# Patient Record
Sex: Male | Born: 1960 | Race: White | Hispanic: No | Marital: Single | Smoking: Former smoker
Health system: Southern US, Community
[De-identification: ages and names within clinical notes are randomized; demographics above are authoritative.]

## PROBLEM LIST (undated history)

## (undated) DIAGNOSIS — Z8679 Personal history of other diseases of the circulatory system: Secondary | ICD-10-CM

## (undated) DIAGNOSIS — I10 Essential (primary) hypertension: Secondary | ICD-10-CM

## (undated) DIAGNOSIS — I429 Cardiomyopathy, unspecified: Secondary | ICD-10-CM

## (undated) DIAGNOSIS — K219 Gastro-esophageal reflux disease without esophagitis: Secondary | ICD-10-CM

## (undated) DIAGNOSIS — I517 Cardiomegaly: Secondary | ICD-10-CM

## (undated) DIAGNOSIS — I351 Nonrheumatic aortic (valve) insufficiency: Secondary | ICD-10-CM

## (undated) DIAGNOSIS — Z87442 Personal history of urinary calculi: Secondary | ICD-10-CM

## (undated) HISTORY — PX: HERNIA REPAIR: SHX51

## (undated) HISTORY — PX: HEMORROIDECTOMY: SUR656

---

## 2007-08-27 ENCOUNTER — Encounter: Admission: RE | Admit: 2007-08-27 | Discharge: 2007-08-27 | Payer: Self-pay | Admitting: Family Medicine

## 2014-12-07 ENCOUNTER — Emergency Department
Admission: EM | Admit: 2014-12-07 | Discharge: 2014-12-07 | Disposition: A | Discharge status: Home / Self Care | Payer: Self-pay | Source: Home / Self Care | Attending: Family Medicine | Admitting: Family Medicine

## 2014-12-07 ENCOUNTER — Telehealth: Payer: Self-pay | Admitting: *Deleted

## 2014-12-07 ENCOUNTER — Encounter: Payer: Self-pay | Admitting: Emergency Medicine

## 2014-12-07 ENCOUNTER — Emergency Department (INDEPENDENT_AMBULATORY_CARE_PROVIDER_SITE_OTHER): Payer: Self-pay

## 2014-12-07 DIAGNOSIS — N200 Calculus of kidney: Secondary | ICD-10-CM

## 2014-12-07 DIAGNOSIS — R109 Unspecified abdominal pain: Secondary | ICD-10-CM

## 2014-12-07 DIAGNOSIS — R1010 Upper abdominal pain, unspecified: Secondary | ICD-10-CM

## 2014-12-07 DIAGNOSIS — R1032 Left lower quadrant pain: Secondary | ICD-10-CM

## 2014-12-07 DIAGNOSIS — K5730 Diverticulosis of large intestine without perforation or abscess without bleeding: Secondary | ICD-10-CM

## 2014-12-07 LAB — COMPLETE METABOLIC PANEL WITH GFR
ALT: 20 U/L (ref 0–53)
AST: 18 U/L (ref 0–37)
Albumin: 4.1 g/dL (ref 3.5–5.2)
Alkaline Phosphatase: 47 U/L (ref 39–117)
BILIRUBIN TOTAL: 0.6 mg/dL (ref 0.2–1.2)
BUN: 13 mg/dL (ref 6–23)
CALCIUM: 9 mg/dL (ref 8.4–10.5)
CHLORIDE: 100 meq/L (ref 96–112)
CO2: 26 meq/L (ref 19–32)
CREATININE: 1.07 mg/dL (ref 0.50–1.35)
GFR, EST NON AFRICAN AMERICAN: 79 mL/min
GLUCOSE: 81 mg/dL (ref 70–99)
Potassium: 4.3 mEq/L (ref 3.5–5.3)
Sodium: 137 mEq/L (ref 135–145)
Total Protein: 6.4 g/dL (ref 6.0–8.3)

## 2014-12-07 LAB — POCT URINALYSIS DIP (MANUAL ENTRY)
BILIRUBIN UA: NEGATIVE
GLUCOSE UA: NEGATIVE
Ketones, POC UA: NEGATIVE
Leukocytes, UA: NEGATIVE
Nitrite, UA: NEGATIVE
Protein Ur, POC: NEGATIVE
SPEC GRAV UA: 1.015 (ref 1.005–1.03)
UROBILINOGEN UA: 0.2 (ref 0–1)
pH, UA: 6 (ref 5–8)

## 2014-12-07 MED ORDER — HYDROCODONE-ACETAMINOPHEN 5-325 MG PO TABS: 1.0000 | ORAL_TABLET | ORAL | Status: DC | PRN

## 2014-12-07 MED ORDER — TAMSULOSIN HCL 0.4 MG PO CAPS: 0.4000 mg | ORAL_CAPSULE | Freq: Every day | ORAL | Status: DC

## 2014-12-07 NOTE — ED Notes (Signed)
Lower left Quadrant pain started yesterday, no pain presently

## 2014-12-07 NOTE — ED Provider Notes (Signed)
CSN: [id number]     Arrival date & time 12/07/14  4098 History   First MD Initiated Contact with Patient 12/07/14 [phone]     Chief Complaint  Patient presents with  . Abdominal Pain     HPI Comments: Four days ago while sitting at a table eating, patient suddenly developed sharp stabbing left abdominal pain.that lasted about an hour.  He noticed that his urine was dark.  The pain resolved spontaneously.  Yesterday, again while sitting, patient developed similar but more severe left abdominal pain radiating to the left flank.  The pain lasted about four hours, and he had an episode of nausea/vomiting.  His pain occurred again this morning at 12:30am.  No fevers, chills, and sweats.  He is assymptomatic at present.  No past history of kidney stones.  Patient is a 54 y.o. male presenting with flank pain. The history is provided by the patient.  Flank Pain This is a new problem. Episode onset: 4 days ago. The problem occurs every several days. The problem has been gradually worsening. Associated symptoms include abdominal pain. Pertinent negatives include no chest pain, no headaches and no shortness of breath. Nothing aggravates the symptoms. Nothing relieves the symptoms. Treatments tried: Aleve. The treatment provided mild relief.    History reviewed. No pertinent past medical history. Past Surgical History  Procedure Laterality Date  . Hernia repair    . Hemorroidectomy     No family history on file. History  Substance Use Topics  . Smoking status: Never Smoker   . Smokeless tobacco: Not on file  . Alcohol Use: No    Review of Systems  Constitutional: Positive for diaphoresis. Negative for fever, chills and fatigue.  HENT: Negative.   Eyes: Negative.   Respiratory: Negative.  Negative for shortness of breath.   Cardiovascular: Negative.  Negative for chest pain.  Gastrointestinal: Positive for nausea, vomiting and abdominal pain. Negative for diarrhea.  Genitourinary: Positive for  flank pain. Negative for dysuria, urgency, frequency, hematuria, decreased urine volume, difficulty urinating and testicular pain.  Musculoskeletal: Negative.   Skin: Negative.   Neurological: Negative for headaches.  All other systems reviewed and are negative.   Allergies  Review of patient's allergies indicates no known allergies.  Home Medications   Prior to Admission medications   Medication Sig Start Date End Date Taking? Authorizing Provider  naproxen sodium (ANAPROX) 220 MG tablet Take 220 mg by mouth 2 (two) times daily with a meal.   Yes Historical Provider, MD  HYDROcodone-acetaminophen (NORCO/VICODIN) 5-325 MG per tablet Take 1 tablet by mouth every 4 (four) hours as needed for moderate pain. 12/07/14   Lattie Haw, MD  tamsulosin (FLOMAX) 0.4 MG CAPS capsule Take 1 capsule (0.4 mg total) by mouth daily after supper. 12/07/14   Lattie Haw, MD   BP 149/91 mmHg  Pulse 105  Temp(Src) 98.2 F (36.8 C) (Oral)  Ht 6' (1.829 m)  Wt 202 lb (91.627 kg)  BMI 27.39 kg/m2  SpO2 97% Physical Exam Nursing notes and Vital Signs reviewed. Appearance:  Patient appears stated age, and in no acute distress Eyes:  Pupils are equal, round, and reactive to light and accomodation.  Extraocular movement is intact.  Conjunctivae are not inflamed  Pharynx:  Normal; moist mucous membranes  Neck:  Supple.  No adenopathy  Lungs:  Clear to auscultation.  Breath sounds are equal.  Heart:  Regular rate and rhythm without murmurs, rubs, or gallops.  Abdomen:  Nontender without masses or  hepatosplenomegaly.  Bowel sounds are present.  No CVA or flank tenderness.  Extremities:  No edema.  No calf tenderness Skin:  No rash present.   ED Course  Procedures  None    Labs Reviewed  COMPLETE METABOLIC PANEL WITH GFR   Narrative:    Performed at:  [facility]                [address], [address]                [place], [place] [address]  POCT URINALYSIS DIP (MANUAL ENTRY):  BLO  moderate; otherwise negative    Imaging Review Ct Renal Stone Study  12/07/2014   CLINICAL DATA:  Left lower quadrant and left flank pain with nausea and vomiting. Microscopic hematuria.  EXAM: CT ABDOMEN AND PELVIS WITHOUT CONTRAST  TECHNIQUE: Multidetector CT imaging of the abdomen and pelvis was performed following the standard protocol without oral or intravenous contrast material administration.  COMPARISON:  None.  FINDINGS: There is patchy bibasilar lung atelectatic change.  Liver is enlarged, measuring 22.4 cm in length. No focal liver lesions are identified on this noncontrast enhanced study. There is no appreciable biliary duct dilatation. Gallbladder wall is not thickened.  Spleen, pancreas, and adrenals appear normal. There is a 4 mm nonobstructing calculus in the upper pole of the right kidney. There is a nearby tiny calculus in the upper pole of the right kidney. There is a 3 mm calculus in the lower pole of the  right kidney, nonobstructing. There is a cyst in the medial mid right kidney measuring 1.8 x 1.5 cm. There is no right renal hydronephrosis. There is no ureteral calculus on the right.  On the left, the kidney is diffusely edematous. There is moderate hydronephrosis on the left. There are two, 1 mm calculi in the mid left kidney. In the lower pole region, there are several 1 mm calculi with a nearby 4 x 2 mm calculus, nonobstructing. There is a focal calculus just beyond the ureteropelvic junction on the left measuring 7 x 6 mm. No other ureteral calculus is appreciable.  In the pelvis, the urinary bladder is midline with normal wall thickness. There are scattered prostatic calculi. There is no pelvic mass or pelvic fluid collection. There are scattered sigmoid diverticula without diverticulitis. Appendix appears within normal limits.  There is no bowel obstruction. No free air or portal venous air. There is no appreciable ascites, adenopathy, or abscess in the abdomen or pelvis. There is  a circumaortic left renal vein, an anatomic variant. There is no demonstrable abdominal aortic aneurysm. There are no blastic or lytic bone lesions. There is degenerative change in the lumbar spine.  IMPRESSION: 7 x 6 mm calculus just beyond the left ureteropelvic junction causing moderate hydronephrosis on the left in left renal edema. There arm multiple small nonobstructing calculi in each kidney.  No bowel obstruction.  No abscess.  Appendix region appears normal.  Hepatomegaly.  No focal liver lesions appreciable.  Scattered sigmoid diverticula without diverticulitis.   Electronically Signed   By: Bretta BangWilliam  Woodruff III M.D.   On: 12/07/2014 09:49     MDM   1. Left lower quadrant pain   2. Flank pain, acute   3. Left nephrolithiasis    Begin Flomax.  Check renal function with CMP  Increase fluid intake.  Strain urine.  May take Ibuprofen 200mg , 4 tabs every 8 hours with food.   If symptoms become significantly worse during the  night or over the weekend, proceed to the local emergency room.  Followup with urologist in 48 hours    Lattie Haw, MD 12/09/14 [phone]

## 2014-12-07 NOTE — Discharge Instructions (Signed)
Increase fluid intake.  Strain urine.  May take Ibuprofen 200mg , 4 tabs every 8 hours with food.   If symptoms become significantly worse during the night or over the weekend, proceed to the local emergency room.    Kidney Stones Kidney stones (urolithiasis) are deposits that form inside your kidneys. The intense pain is caused by the stone moving through the urinary tract. When the stone moves, the ureter goes into spasm around the stone. The stone is usually passed in the urine.  CAUSES   A disorder that makes certain neck glands produce too much parathyroid hormone (primary hyperparathyroidism).  A buildup of uric acid crystals, similar to gout in your joints.  Narrowing (stricture) of the ureter.  A kidney obstruction present at birth (congenital obstruction).  Previous surgery on the kidney or ureters.  Numerous kidney infections. SYMPTOMS   Feeling sick to your stomach (nauseous).  Throwing up (vomiting).  Blood in the urine (hematuria).  Pain that usually spreads (radiates) to the groin.  Frequency or urgency of urination. DIAGNOSIS   Taking a history and physical exam.  Blood or urine tests.  CT scan.  Occasionally, an examination of the inside of the urinary bladder (cystoscopy) is performed. TREATMENT   Observation.  Increasing your fluid intake.  Extracorporeal shock wave lithotripsy--This is a noninvasive procedure that uses shock waves to break up kidney stones.  Surgery may be needed if you have severe pain or persistent obstruction. There are various surgical procedures. Most of the procedures are performed with the use of small instruments. Only small incisions are needed to accommodate these instruments, so recovery time is minimized. The size, location, and chemical composition are all important variables that will determine the proper choice of action for you. Talk to your health care provider to better understand your situation so that you will  minimize the risk of injury to yourself and your kidney.  HOME CARE INSTRUCTIONS   Drink enough water and fluids to keep your urine clear or pale yellow. This will help you to pass the stone or stone fragments.  Strain all urine through the provided strainer. Keep all particulate matter and stones for your health care provider to see. The stone causing the pain may be as small as a grain of salt. It is very important to use the strainer each and every time you pass your urine. The collection of your stone will allow your health care provider to analyze it and verify that a stone has actually passed. The stone analysis will often identify what you can do to reduce the incidence of recurrences.  Only take over-the-counter or prescription medicines for pain, discomfort, or fever as directed by your health care provider.  Make a follow-up appointment with your health care provider as directed.  Get follow-up X-rays if required. The absence of pain does not always mean that the stone has passed. It may have only stopped moving. If the urine remains completely obstructed, it can cause loss of kidney function or even complete destruction of the kidney. It is your responsibility to make sure X-rays and follow-ups are completed. Ultrasounds of the kidney can show blockages and the status of the kidney. Ultrasounds are not associated with any radiation and can be performed easily in a matter of minutes. SEEK MEDICAL CARE IF:  You experience pain that is progressive and unresponsive to any pain medicine you have been prescribed. SEEK IMMEDIATE MEDICAL CARE IF:   Pain cannot be controlled with the prescribed medicine.  You have a fever or shaking chills.  The severity or intensity of pain increases over 18 hours and is not relieved by pain medicine.  You develop a new onset of abdominal pain.  You feel faint or pass out.  You are unable to urinate. MAKE SURE YOU:   Understand these  instructions.  Will watch your condition.  Will get help right away if you are not doing well or get worse. Document Released: 08/14/2005 Document Revised: 04/16/2013 Document Reviewed: 01/15/2013 [facility] Patient Information 2015 [place], [place]. This information is not intended to replace advice given to you by your health care provider. Make sure you discuss any questions you have with your health care provider.

## 2015-02-12 ENCOUNTER — Emergency Department (INDEPENDENT_AMBULATORY_CARE_PROVIDER_SITE_OTHER)
Admission: EM | Admit: 2015-02-12 | Discharge: 2015-02-12 | Disposition: A | Discharge status: Home / Self Care | Payer: Self-pay | Source: Home / Self Care | Attending: Family Medicine | Admitting: Family Medicine

## 2015-02-12 ENCOUNTER — Encounter: Payer: Self-pay | Admitting: Emergency Medicine

## 2015-02-12 DIAGNOSIS — H6123 Impacted cerumen, bilateral: Secondary | ICD-10-CM

## 2015-02-12 MED ORDER — CARBAMIDE PEROXIDE 6.5 % OT SOLN: OTIC | Status: DC

## 2015-02-12 NOTE — Discharge Instructions (Signed)
Cerumen Impaction °A cerumen impaction is when the wax in your ear forms a plug. This plug usually causes reduced hearing. Sometimes it also causes an earache or dizziness. Removing a cerumen impaction can be difficult and painful. The wax sticks to the ear canal. The canal is sensitive and bleeds easily. If you try to remove a heavy wax buildup with a cotton tipped swab, you may push it in further. °Irrigation with water, suction, and small ear curettes may be used to clear out the wax. If the impaction is fixed to the skin in the ear canal, ear drops may be needed for a few days to loosen the wax. People who build up a lot of wax frequently can use ear wax removal products available in your local drugstore. °SEEK MEDICAL CARE IF:  °You develop an earache, increased hearing loss, or marked dizziness. °Document Released: 09/21/2004 Document Revised: 11/06/2011 Document Reviewed: 11/11/2009 °[employer]® Patient Information ©2015 [employer]. This information is not intended to replace advice given to you by your health care provider. Make sure you discuss any questions you have with your health care provider. ° °

## 2015-02-12 NOTE — ED Notes (Signed)
Pt states he has been unable to hear from his left ear x1 month. Denies pain or pressure.

## 2015-02-12 NOTE — ED Provider Notes (Signed)
CSN: [id number]     Arrival date & time 02/12/15  1617 History   First MD Initiated Contact with Patient 02/12/15 1637     Chief Complaint  Patient presents with  . Otalgia      HPI Comments: About one month ago, patient's left ear would feel clogged each morning.  During the past two weeks he has had decreased hearing in his left ear without pain.    Patient is a 54 y.o. male presenting with ear pain. The history is provided by the patient.  Otalgia Location:  Left Behind ear:  No abnormality Quality: decreased hearing. Severity:  No pain Onset quality:  Gradual Duration:  1 month Timing:  Constant Progression:  Worsening Chronicity:  New Relieved by:  Nothing Worsened by:  Nothing tried Ineffective treatments:  OTC medications Associated symptoms: hearing loss   Associated symptoms: no congestion, no cough, no ear discharge, no fever, no headaches, no neck pain, no rash, no rhinorrhea and no sore throat     History reviewed. No pertinent past medical history. Past Surgical History  Procedure Laterality Date  . Hernia repair    . Hemorroidectomy     History reviewed. No pertinent family history. History  Substance Use Topics  . Smoking status: Never Smoker   . Smokeless tobacco: Not on file  . Alcohol Use: No    Review of Systems  Constitutional: Negative for fever.  HENT: Positive for ear pain and hearing loss. Negative for congestion, ear discharge, rhinorrhea and sore throat.   Respiratory: Negative for cough.   Musculoskeletal: Negative for neck pain.  Skin: Negative for rash.  Neurological: Negative for headaches.  All other systems reviewed and are negative.   Allergies  Review of patient's allergies indicates no known allergies.  Home Medications   Prior to Admission medications   Medication Sig Start Date End Date Taking? Authorizing Provider  carbamide peroxide (DEBROX) 6.5 % otic solution Place 5 to 10 drops in affected ear BID for 4 days, then  flush ear with warm water. 02/12/15   Lattie Haw, MD  naproxen sodium (ANAPROX) 220 MG tablet Take 220 mg by mouth 2 (two) times daily with a meal.    Historical Provider, MD   BP 131/79 mmHg  Pulse 88  Temp(Src) 98 F (36.7 C) (Oral)  SpO2 97% Physical Exam Nursing notes and Vital Signs reviewed. Appearance:  Patient appears stated age, and in no acute distress Eyes:  Pupils are equal, round, and reactive to light and accomodation.  Extraocular movement is intact.  Conjunctivae are not inflamed  Ears:   Both canals are completely occluded with cerumen.  Post lavage, canals remain partially occluded Nose:   Normal turbinates.  No sinus tenderness.   Pharynx:  Normal Neck:  Supple.  No adenopathy Skin:  No rash present.   ED Course  Procedures  None     MDM   1. Cerumen impaction, bilateral     Rx for Debrox otic solution.  Use BID for 4 days, then try flushing ears with warm water.  Also may return in four days for repeat lavage, or follow-up with ENT.    Lattie Haw, MD 02/12/15 [phone]

## 2016-08-31 ENCOUNTER — Emergency Department
Admission: EM | Admit: 2016-08-31 | Discharge: 2016-08-31 | Disposition: A | Discharge status: Home / Self Care | Payer: Self-pay | Source: Home / Self Care | Attending: Family Medicine | Admitting: Family Medicine

## 2016-08-31 ENCOUNTER — Encounter: Payer: Self-pay | Admitting: *Deleted

## 2016-08-31 DIAGNOSIS — J069 Acute upper respiratory infection, unspecified: Secondary | ICD-10-CM

## 2016-08-31 HISTORY — DX: Personal history of other diseases of the circulatory system: Z86.79

## 2016-08-31 HISTORY — DX: Cardiomyopathy, unspecified: I42.9

## 2016-08-31 HISTORY — DX: Cardiomegaly: I51.7

## 2016-08-31 MED ORDER — ONDANSETRON 4 MG PO TBDP
4.0000 mg | ORAL_TABLET | Freq: Once | ORAL | Status: AC
  Administered 2016-08-31: 4 mg via ORAL

## 2016-08-31 MED ORDER — AMOXICILLIN-POT CLAVULANATE 875-125 MG PO TABS: 1.0000 | ORAL_TABLET | Freq: Two times a day (BID) | ORAL | 0 refills | Status: DC

## 2016-08-31 MED ORDER — METHYLPREDNISOLONE ACETATE 80 MG/ML IJ SUSP
80.0000 mg | Freq: Once | INTRAMUSCULAR | Status: AC
  Administered 2016-08-31: 80 mg via INTRAMUSCULAR

## 2016-08-31 MED ORDER — BENZONATATE 100 MG PO CAPS: 100.0000 mg | ORAL_CAPSULE | Freq: Three times a day (TID) | ORAL | 0 refills | Status: DC

## 2016-08-31 NOTE — ED Provider Notes (Signed)
CSN: [id number]     Arrival date & time 08/31/16  0810 History   First MD Initiated Contact with Patient 08/31/16 0827     Chief Complaint  Patient presents with  . Cough  . Generalized Body Aches  . Fatigue   (Consider location/radiation/quality/duration/timing/severity/associated sxs/prior Treatment) HPI  Marcelis Wissner is a 56 y.o. male presenting to UC with c/o 2 weeks of mild to moderately productive cough, nasal congestion, fatigue, and possible low-grade fever.  He notes he started to feel better but then felt a lot worse last night.  Cough did keep him up at night.  He has taken OTC cough/cold medication with mild temporary relief. Others around him have also been sick.  Denies hx of asthma but does have hx of CAD and takes several cardiac medications daily.  Denies chest pain or SOB. Denies n/v/d.    Past Medical History:  Diagnosis Date  . Cardiomyopathy (HCC)   . Enlarged heart   . History of myocarditis    Past Surgical History:  Procedure Laterality Date  . HEMORROIDECTOMY    . HERNIA REPAIR     History reviewed. No pertinent family history. Social History  Substance Use Topics  . Smoking status: Never Smoker  . Smokeless tobacco: Never Used  . Alcohol use No    Review of Systems  Constitutional: Positive for fever ( subjective). Negative for chills.  HENT: Positive for congestion, postnasal drip, rhinorrhea and sinus pressure. Negative for ear pain, sinus pain, sore throat, trouble swallowing and voice change.   Respiratory: Positive for cough. Negative for shortness of breath.   Cardiovascular: Negative for chest pain and palpitations.  Gastrointestinal: Negative for abdominal pain, diarrhea, nausea and vomiting.  Musculoskeletal: Positive for arthralgias and myalgias. Negative for back pain.       Body aches  Skin: Negative for rash.  Neurological: Negative for dizziness, light-headedness and headaches.    Allergies  Patient has no known allergies.  Home  Medications   Prior to Admission medications   Medication Sig Start Date End Date Taking? Authorizing Provider  aspirin EC 81 MG tablet Take 81 mg by mouth daily.   Yes Historical Provider, MD  CARVEDILOL PO Take by mouth.   Yes Historical Provider, MD  furosemide (LASIX) 40 MG tablet Take 40 mg by mouth.   Yes Historical Provider, MD  losartan (COZAAR) 50 MG tablet Take 50 mg by mouth daily.   Yes Historical Provider, MD  spironolactone (ALDACTONE) 25 MG tablet Take 25 mg by mouth daily.   Yes Historical Provider, MD  amoxicillin-clavulanate (AUGMENTIN) 875-125 MG tablet Take 1 tablet by mouth 2 (two) times daily. One po bid x 7 days 08/31/16   Junius Finner, PA-C  benzonatate (TESSALON) 100 MG capsule Take 1-2 capsules (100-200 mg total) by mouth every 8 (eight) hours. 08/31/16   Junius Finner, PA-C   Meds Ordered and Administered this Visit   Medications  ondansetron (ZOFRAN-ODT) disintegrating tablet 4 mg (4 mg Oral Given 08/31/16 0843)  methylPREDNISolone acetate (DEPO-MEDROL) injection 80 mg (80 mg Intramuscular Given 08/31/16 0843)    BP 113/71 (BP Location: Left Arm)   Pulse 104   Temp 98.9 F (37.2 C) (Oral)   Resp 18   Wt 187 lb (84.8 kg)   SpO2 96%   BMI 25.36 kg/m  No data found.   Physical Exam  Constitutional: He is oriented to person, place, and time. He appears well-developed and well-nourished. No distress.  HENT:  Head: Normocephalic and atraumatic.  Right Ear: Tympanic membrane normal.  Left Ear: Tympanic membrane normal.  Nose: Rhinorrhea present. Right sinus exhibits maxillary sinus tenderness. Right sinus exhibits no frontal sinus tenderness. Left sinus exhibits no maxillary sinus tenderness and no frontal sinus tenderness.  Mouth/Throat: Uvula is midline and mucous membranes are normal. Posterior oropharyngeal erythema present. No oropharyngeal exudate, posterior oropharyngeal edema or tonsillar abscesses.  Eyes: EOM are normal.  Neck: Normal range of motion.  Neck supple.  Cardiovascular: Normal rate and regular rhythm.   Pulmonary/Chest: Effort normal and breath sounds normal. No stridor. No respiratory distress. He has no wheezes. He has no rales.  Musculoskeletal: Normal range of motion.  Lymphadenopathy:    He has no cervical adenopathy.  Neurological: He is alert and oriented to person, place, and time.  Skin: Skin is warm and dry. He is not diaphoretic.  Psychiatric: He has a normal mood and affect. His behavior is normal.  Nursing note and vitals reviewed.   Urgent Care Course   Clinical Course     Procedures (including critical care time)  Labs Review Labs Reviewed - No data to display  Imaging Review No results found.    MDM   1. Upper respiratory tract infection, unspecified type    Pt c/o 2 weeks of URI symptoms, worse last night. Due to duration, will cover for bacterial cause.    Pt requested a "shot" Offered depo-medrol shot to help with inflammation and soreness.  Pt agreeable.    Rx: Augmentin and tessalon  Encouraged f/u with PCP in 1 week if not improving, sooner if worsening.       Junius FinnerErin O'Malley, PA-C 08/31/16 [phone]

## 2016-08-31 NOTE — ED Triage Notes (Signed)
Patient c/o 2 weeks of cough, congestion , fatigue, possible low grade fever. He improved but became worse last night.

## 2020-02-12 ENCOUNTER — Emergency Department
Admission: EM | Admit: 2020-02-12 | Discharge: 2020-02-12 | Disposition: A | Discharge status: Home / Self Care | Payer: Self-pay | Source: Home / Self Care

## 2020-02-12 ENCOUNTER — Other Ambulatory Visit: Payer: Self-pay

## 2020-02-12 ENCOUNTER — Emergency Department (INDEPENDENT_AMBULATORY_CARE_PROVIDER_SITE_OTHER): Payer: Self-pay

## 2020-02-12 DIAGNOSIS — N3001 Acute cystitis with hematuria: Secondary | ICD-10-CM

## 2020-02-12 DIAGNOSIS — N200 Calculus of kidney: Secondary | ICD-10-CM

## 2020-02-12 DIAGNOSIS — R109 Unspecified abdominal pain: Secondary | ICD-10-CM

## 2020-02-12 LAB — POCT URINALYSIS DIP (MANUAL ENTRY)
Glucose, UA: NEGATIVE mg/dL
Nitrite, UA: POSITIVE — AB
Protein Ur, POC: 30 mg/dL — AB
Spec Grav, UA: 1.025 (ref 1.010–1.025)
Urobilinogen, UA: 1 E.U./dL
pH, UA: 6 (ref 5.0–8.0)

## 2020-02-12 LAB — POCT CBC W AUTO DIFF (K'VILLE URGENT CARE)

## 2020-02-12 LAB — COMPLETE METABOLIC PANEL WITH GFR
AG Ratio: 1.5 (calc) (ref 1.0–2.5)
ALT: 18 U/L (ref 9–46)
AST: 13 U/L (ref 10–35)
Albumin: 4.1 g/dL (ref 3.6–5.1)
Alkaline phosphatase (APISO): 52 U/L (ref 35–144)
BUN: 19 mg/dL (ref 7–25)
CO2: 28 mmol/L (ref 20–32)
Calcium: 8.7 mg/dL (ref 8.6–10.3)
Chloride: 104 mmol/L (ref 98–110)
Creat: 1.17 mg/dL (ref 0.70–1.33)
GFR, Est African American: 80 mL/min/{1.73_m2} (ref >=60)
GFR, Est Non African American: 69 mL/min/{1.73_m2} (ref >=60)
Globulin: 2.7 g/dL (calc) (ref 1.9–3.7)
Glucose, Bld: 88 mg/dL (ref 65–99)
Potassium: 4.2 mmol/L (ref 3.5–5.3)
Sodium: 138 mmol/L (ref 135–146)
Total Bilirubin: 0.9 mg/dL (ref 0.2–1.2)
Total Protein: 6.8 g/dL (ref 6.1–8.1)

## 2020-02-12 MED ORDER — TAMSULOSIN HCL 0.4 MG PO CAPS: 0.4000 mg | ORAL_CAPSULE | Freq: Every day | ORAL | 0 refills | Status: AC

## 2020-02-12 MED ORDER — DOXYCYCLINE HYCLATE 100 MG PO CAPS: 100.0000 mg | ORAL_CAPSULE | Freq: Two times a day (BID) | ORAL | 0 refills | Status: AC

## 2020-02-12 NOTE — ED Triage Notes (Signed)
Patient reports 4-5 days intermittent right sided flank pain. Denies pain yesterday or today. Reports fevers tmax 100.3. Has been taking ibuprofen and tylenol with relief. Denies dysuria, hematuria. Does have a history of kidney stones but states this presentation is different.

## 2020-02-12 NOTE — ED Provider Notes (Signed)
[facility]    CSN: [id number] Arrival date & time: 02/12/20  0827      History   Chief Complaint Chief Complaint  Patient presents with  . Flank Pain    HPI Andre Davis is a 59 y.o. male.   HPI Andre Davis is a 59 y.o. male presenting to UC with c/o intermittent Right flank pain that started about 4-5 days ago. Improves in the morning but moderate to severe later in the day.  Associated temp of 100.3*F.  He has taken ibuprofen and tylenol with temporary relief.  Denies n/v/d. Denies dysuria or hematuria. He does report hx of a kidney stone about 1 year ago, complicated by a kidney infection that required him to be hospitalized for over 1 week prior to having the stone removed.  However, pt states this presentation is different than his last stone.    Past Medical History:  Diagnosis Date  . Cardiomyopathy (HCC)   . Enlarged heart   . History of myocarditis     There are no problems to display for this patient.   Past Surgical History:  Procedure Laterality Date  . HEMORROIDECTOMY    . HERNIA REPAIR         Home Medications    Prior to Admission medications   Medication Sig Start Date End Date Taking? Authorizing Provider  aspirin EC 81 MG tablet Take 81 mg by mouth daily.   Yes [provider]  CARVEDILOL PO Take by mouth.   Yes [provider]  furosemide (LASIX) 40 MG tablet Take 40 mg by mouth.   Yes [provider]  losartan (COZAAR) 50 MG tablet Take 50 mg by mouth daily.   Yes [provider]  benzonatate (TESSALON) 100 MG capsule Take 1-2 capsules (100-200 mg total) by mouth every 8 (eight) hours. 08/31/16   Lurene Shadow, PA-C  doxycycline (VIBRAMYCIN) 100 MG capsule Take 1 capsule (100 mg total) by mouth 2 (two) times daily for 10 days. 02/12/20 02/22/20  Lurene Shadow, PA-C  spironolactone (ALDACTONE) 25 MG tablet Take 25 mg by mouth daily.    [provider]  tamsulosin (FLOMAX) 0.4 MG CAPS capsule  Take 1 capsule (0.4 mg total) by mouth daily. 02/12/20   Lurene Shadow, PA-C    Family History History reviewed. No pertinent family history.  Social History Social History   Tobacco Use  . Smoking status: Never Smoker  . Smokeless tobacco: Never Used  Substance Use Topics  . Alcohol use: No  . Drug use: No     Allergies   Patient has no known allergies.   Review of Systems Review of Systems  Constitutional: Positive for fever. Negative for chills.  HENT: Negative for congestion, ear pain, sore throat, trouble swallowing and voice change.   Respiratory: Negative for cough and shortness of breath.   Cardiovascular: Negative for chest pain and palpitations.  Gastrointestinal: Negative for abdominal pain, diarrhea, nausea and vomiting.  Genitourinary: Positive for flank pain. Negative for dysuria, frequency and hematuria.  Musculoskeletal: Negative for arthralgias, back pain and myalgias.  Skin: Negative for rash.  All other systems reviewed and are negative.    Physical Exam Triage Vital Signs ED Triage Vitals  Enc Vitals Group     BP 02/12/20 0843 (!) 158/81     Pulse Rate 02/12/20 0843 78     Resp 02/12/20 0843 12     Temp 02/12/20 0843 98.2 F (36.8 C)  Temp Source 02/12/20 0843 Oral     SpO2 02/12/20 0843 98 %     Weight --      Height --      Head Circumference --      Peak Flow --      Pain Score 02/12/20 0839 0     Pain Loc --      Pain Edu? --      Excl. in GC? --    No data found.  Updated Vital Signs BP (!) 158/81 (BP Location: Right Arm)   Pulse 78   Temp 98.2 F (36.8 C) (Oral)   Resp 12   SpO2 98%   Visual Acuity Right Eye Distance:   Left Eye Distance:   Bilateral Distance:    Right Eye Near:   Left Eye Near:    Bilateral Near:     Physical Exam Vitals and nursing note reviewed.  Constitutional:      General: He is not in acute distress.    Appearance: Normal appearance. He is well-developed. He is not ill-appearing,  toxic-appearing or diaphoretic.  HENT:     Head: Normocephalic and atraumatic.     Mouth/Throat:     Mouth: Mucous membranes are moist.     Pharynx: Oropharynx is clear.  Cardiovascular:     Rate and Rhythm: Normal rate and regular rhythm.  Pulmonary:     Effort: Pulmonary effort is normal. No respiratory distress.     Breath sounds: Normal breath sounds. No stridor. No wheezing, rhonchi or rales.  Abdominal:     General: There is no distension.     Palpations: Abdomen is soft. There is no mass.     Tenderness: There is abdominal tenderness (mild to Right side). There is no right CVA tenderness, left CVA tenderness, guarding or rebound.     Hernia: No hernia is present.  Musculoskeletal:        General: Normal range of motion.     Cervical back: Normal range of motion.  Skin:    General: Skin is warm and dry.  Neurological:     Mental Status: He is alert and oriented to person, place, and time.  Psychiatric:        Behavior: Behavior normal.      UC Treatments / Results  Labs (all labs ordered are listed, but only abnormal results are displayed) Labs Reviewed  POCT URINALYSIS DIP (MANUAL ENTRY) - Abnormal; Notable for the following components:      Result Value   Clarity, UA turbid (*)    Bilirubin, UA small (*)    Ketones, POC UA trace (5) (*)    Blood, UA large (*)    Protein Ur, POC =30 (*)    Nitrite, UA Positive (*)    Leukocytes, UA Small (1+) (*)    All other components within normal limits  COMPLETE METABOLIC PANEL WITH GFR  POCT CBC W AUTO DIFF ([facility])    EKG   Radiology CT Renal Stone Study  Result Date: 02/12/2020 CLINICAL DATA:  Right flank pain.  Evaluate for kidney stone. EXAM: CT ABDOMEN AND PELVIS WITHOUT CONTRAST TECHNIQUE: Multidetector CT imaging of the abdomen and pelvis was performed following the standard protocol without IV contrast. COMPARISON:  12/07/2014 FINDINGS: Lower chest: Scarring is again noted within the lung bases.  No acute findings. Hepatobiliary: No suspicious liver abnormality. Small low-attenuation foci within the lateral segment of left lobe of liver are too small to characterize measuring up to  6 mm. Gallbladder unremarkable. No biliary dilatation Pancreas: Unremarkable. No pancreatic ductal dilatation or surrounding inflammatory changes. Spleen: Normal in size without focal abnormality. Adrenals/Urinary Tract: Normal appearance of the adrenal glands. Bilateral kidney stones. Multiple large stones within the right renal pelvis and inferior pole collecting system. The largest stone is in the right renal pelvis measuring 1.8 by 0.8 cm, image 38/2. Several small stones are noted within the inferior pole collecting system of the left kidney. These measure up to 4 mm. No hydronephrosis identified bilaterally. No ureteral calculi. Cyst within medial cortex of the right kidney measures 1.6 cm and is technically incompletely characterized without IV contrast. The urinary bladder is unremarkable. Stomach/Bowel: Stomach is within normal limits. Appendix appears normal. No evidence of bowel wall thickening, distention, or inflammatory changes. Vascular/Lymphatic: No significant vascular findings are present. No enlarged abdominal or pelvic lymph nodes. Reproductive: Prostate is unremarkable. Other: No free fluid or fluid collections Musculoskeletal: No acute or significant osseous findings. Bilateral L4-5 facet arthropathy. First degree anterolisthesis of L4 on L5 noted with mild degenerative disc disease. IMPRESSION: 1. Bilateral nephrolithiasis. There is a large stone within the right renal pelvis measuring 1.8 x 0.8 cm. No significant hydronephrosis noted. 2. Lumbar degenerative disc disease and facet arthropathy. Electronically Signed   By: Kerby Moors M.D.   On: 02/12/2020 10:17    Procedures Procedures (including critical care time)  Medications Ordered in UC Medications - No data to display  Initial Impression /  Assessment and Plan / UC Course  I have reviewed the triage vital signs and the nursing notes.  Pertinent labs & imaging results that were available during my care of the patient were reviewed by me and considered in my medical decision making (see chart for details).     UA c/w infection Due to hx of complications from renal stones, CT stone study and CMP ordered CBC: unremarkable Discussed imaging with pt Will start pt on doxycycline for UTI Culture: pending Encouraged close f/u with urology due to location, size and pt being symptomatic with Right renal stone.    Discussed symptoms that warrant emergent care in the ED. AVS provided   Final Clinical Impressions(s) / UC Diagnoses   Final diagnoses:  Right flank pain  Acute cystitis with hematuria  Bilateral nephrolithiasis     Discharge Instructions      You do have a large kidney stone in your Right kidney that could be contributing to your intermittent pain. You also have a urinary infection, which you are being prescribed antibiotics for.  Please take your antibiotic as prescribed. A urine culture has been sent to check the severity of your urinary infection and to determine if you are on the most appropriate antibiotic. The results should come back within 2-3 days. You will only be notified if a medication change is indicated.   Due to the size of the kidney stone on the right side, it is recommended you call to schedule a follow up appointment with a urologist as soon as possible to discuss treatment.  Call 911 or go to the hospital if your symptoms worsening- severe pain, vomiting, difficulty or unable to urinate, or other new concerning symptoms develop.      ED Prescriptions    Medication Sig Dispense Auth. Provider   tamsulosin (FLOMAX) 0.4 MG CAPS capsule Take 1 capsule (0.4 mg total) by mouth daily. 30 capsule Leeroy Cha O, PA-C   doxycycline (VIBRAMYCIN) 100 MG capsule Take 1 capsule (100 mg total) by  mouth  2 (two) times daily for 10 days. 20 capsule Lurene Shadow, [place]     I have reviewed the PDMP during this encounter.   Lurene Shadow, [place] 02/12/20 1126

## 2020-02-12 NOTE — Discharge Instructions (Signed)
  You do have a large kidney stone in your Right kidney that could be contributing to your intermittent pain. You also have a urinary infection, which you are being prescribed antibiotics for.  Please take your antibiotic as prescribed. A urine culture has been sent to check the severity of your urinary infection and to determine if you are on the most appropriate antibiotic. The results should come back within 2-3 days. You will only be notified if a medication change is indicated.   Due to the size of the kidney stone on the right side, it is recommended you call to schedule a follow up appointment with a urologist as soon as possible to discuss treatment.  Call 911 or go to the hospital if your symptoms worsening- severe pain, vomiting, difficulty or unable to urinate, or other new concerning symptoms develop.

## 2020-02-12 NOTE — ED Triage Notes (Signed)
Stat CMP called, confirmation number [id number]

## 2020-02-16 ENCOUNTER — Other Ambulatory Visit (HOSPITAL_COMMUNITY): Payer: Self-pay | Admitting: Urology

## 2020-02-16 ENCOUNTER — Other Ambulatory Visit: Payer: Self-pay | Admitting: Urology

## 2020-02-16 DIAGNOSIS — N200 Calculus of kidney: Secondary | ICD-10-CM

## 2020-03-16 ENCOUNTER — Encounter (HOSPITAL_COMMUNITY): Payer: Self-pay

## 2020-03-16 NOTE — Patient Instructions (Signed)
DUE TO COVID-19 ONLY ONE VISITOR IS ALLOWED TO COME WITH YOU AND STAY IN THE WAITING ROOM ONLY DURING PRE OP AND PROCEDURE DAY OF SURGERY. TWO VISITOR MAY VISIT WITH YOU AFTER SURGERY IN YOUR PRIVATE ROOM DURING VISITING HOURS ONLY! 10-a- 8p  YOU NEED TO HAVE A COVID 19 TEST ON__7-29-21_____ @_______ , THIS TEST MUST BE DONE BEFORE SURGERY, COME  [address], [place] , .  [facility]) ONCE YOUR COVID TEST IS COMPLETED, PLEASE BEGIN THE QUARANTINE INSTRUCTIONS AS OUTLINED IN YOUR HANDOUT.                Andre Davis  03/16/2020   Your procedure is scheduled on: 03-29-20    Report to [facility] Main  Entrance   Report to admitting at      10 00 AM     Call this number if you have problems the morning of surgery [phone]    Remember: Do not eat food  :After Midnight. You may have clear liquids until 0900 am then nothing by mouth.   BRUSH YOUR TEETH MORNING OF SURGERY AND RINSE YOUR MOUTH OUT, NO CHEWING GUM CANDY OR MINTS.     Take these medicines the morning of surgery with A SIP OF WATER: Flomax, zyrtec, carvedilol                                 You may not have any metal on your body including hair pins and              piercings  Do not wear jewelry,  lotions, powders or perfumes, deodorant                 Men may shave face and neck.   Do not bring valuables to the hospital. [facility] IS NOT             RESPONSIBLE   FOR VALUABLES.  Contacts, dentures or bridgework may not be worn into surgery.  Leave suitcase in the car. After surgery it may be brought to your room.     Patients discharged the day of surgery will not be allowed to drive home. IF YOU ARE HAVING SURGERY AND GOING HOME THE SAME DAY, YOU MUST HAVE AN ADULT TO DRIVE YOU HOME AND BE WITH YOU FOR 24 HOURS. YOU MAY GO HOME BY TAXI OR UBER OR ORTHERWISE, BUT AN ADULT MUST ACCOMPANY YOU HOME AND STAY WITH YOU FOR 24 HOURS.  Name and phone number of your driver:  Special  Instructions: N/A              Please read over the following fact sheets you were given: _____________________________________________________________________             [facility] - Preparing for Surgery Before surgery, you can play an important role.  Because skin is not sterile, your skin needs to be as free of germs as possible.  You can reduce the number of germs on your skin by washing with CHG (chlorahexidine gluconate) soap before surgery.  CHG is an antiseptic cleaner which kills germs and bonds with the skin to continue killing germs even after washing. Please DO NOT use if you have an allergy to CHG or antibacterial soaps.  If your skin becomes reddened/irritated stop using the CHG and inform your nurse when you arrive at Short Stay. Do not shave (including legs and underarms)  for at least 48 hours prior to the first CHG shower.  You may shave your face/neck. Please follow these instructions carefully:  1.  Shower with CHG Soap the night before surgery and the  morning of Surgery.  2.  If you choose to wash your hair, wash your hair first as usual with your  normal  shampoo.  3.  After you shampoo, rinse your hair and body thoroughly to remove the  shampoo.                           4.  Use CHG as you would any other liquid soap.  You can apply chg directly  to the skin and wash                       Gently with a scrungie or clean washcloth.  5.  Apply the CHG Soap to your body ONLY FROM THE NECK DOWN.   Do not use on face/ open                           Wound or open sores. Avoid contact with eyes, ears mouth and genitals (private parts).                       Wash face,  Genitals (private parts) with your normal soap.             6.  Wash thoroughly, paying special attention to the area where your surgery  will be performed.  7.  Thoroughly rinse your body with warm water from the neck down.  8.  DO NOT shower/wash with your normal soap after using and rinsing off  the CHG  Soap.                9.  Pat yourself dry with a clean towel.            10.  Wear clean pajamas.            11.  Place clean sheets on your bed the night of your first shower and do not  sleep with pets. Day of Surgery : Do not apply any lotions/deodorants the morning of surgery.  Please wear clean clothes to the hospital/surgery center.  FAILURE TO FOLLOW THESE INSTRUCTIONS MAY RESULT IN THE CANCELLATION OF YOUR SURGERY PATIENT SIGNATURE_________________________________  NURSE SIGNATURE__________________________________  ________________________________________________________________________  WHAT IS A BLOOD TRANSFUSION? Blood Transfusion Information  A transfusion is the replacement of blood or some of its parts. Blood is made up of multiple cells which provide different functions.  Red blood cells carry oxygen and are used for blood loss replacement.  White blood cells fight against infection.  Platelets control bleeding.  Plasma helps clot blood.  Other blood products are available for specialized needs, such as hemophilia or other clotting disorders. BEFORE THE TRANSFUSION  Who gives blood for transfusions?   Healthy volunteers who are fully evaluated to make sure their blood is safe. This is blood bank blood. Transfusion therapy is the safest it has ever been in the practice of medicine. Before blood is taken from a donor, a complete history is taken to make sure that person has no history of diseases nor engages in risky social behavior (examples are intravenous drug use or sexual activity with multiple partners). The donor's travel history is screened to minimize risk of transmitting  infections, such as malaria. The donated blood is tested for signs of infectious diseases, such as HIV and hepatitis. The blood is then tested to be sure it is compatible with you in order to minimize the chance of a transfusion reaction. If you or a relative donates blood, this is often done in  anticipation of surgery and is not appropriate for emergency situations. It takes many days to process the donated blood. RISKS AND COMPLICATIONS Although transfusion therapy is very safe and saves many lives, the main dangers of transfusion include:   Getting an infectious disease.  Developing a transfusion reaction. This is an allergic reaction to something in the blood you were given. Every precaution is taken to prevent this. The decision to have a blood transfusion has been considered carefully by your caregiver before blood is given. Blood is not given unless the benefits outweigh the risks. AFTER THE TRANSFUSION  Right after receiving a blood transfusion, you will usually feel much better and more energetic. This is especially true if your red blood cells have gotten low (anemic). The transfusion raises the level of the red blood cells which carry oxygen, and this usually causes an energy increase.  The nurse administering the transfusion will monitor you carefully for complications. HOME CARE INSTRUCTIONS  No special instructions are needed after a transfusion. You may find your energy is better. Speak with your caregiver about any limitations on activity for underlying diseases you may have. SEEK MEDICAL CARE IF:   Your condition is not improving after your transfusion.  You develop redness or irritation at the intravenous (IV) site. SEEK IMMEDIATE MEDICAL CARE IF:  Any of the following symptoms occur over the next 12 hours:  Shaking chills.  You have a temperature by mouth above 102 F (38.9 C), not controlled by medicine.  Chest, back, or muscle pain.  People around you feel you are not acting correctly or are confused.  Shortness of breath or difficulty breathing.  Dizziness and fainting.  You get a rash or develop hives.  You have a decrease in urine output.  Your urine turns a dark color or changes to pink, red, or brown. Any of the following symptoms occur over  the next 10 days:  You have a temperature by mouth above 102 F (38.9 C), not controlled by medicine.  Shortness of breath.  Weakness after normal activity.  The white part of the eye turns yellow (jaundice).  You have a decrease in the amount of urine or are urinating less often.  Your urine turns a dark color or changes to pink, red, or brown. Document Released: 08/11/2000 Document Revised: 11/06/2011 Document Reviewed: 03/30/2008 [facility] Patient Information 2014 [place], [place].  _______________________________________________________________________

## 2020-03-17 ENCOUNTER — Encounter (INDEPENDENT_AMBULATORY_CARE_PROVIDER_SITE_OTHER): Payer: Self-pay

## 2020-03-17 ENCOUNTER — Other Ambulatory Visit: Payer: Self-pay

## 2020-03-17 ENCOUNTER — Encounter (HOSPITAL_COMMUNITY)
Admission: RE | Admit: 2020-03-17 | Discharge: 2020-03-17 | Disposition: A | Discharge status: Home / Self Care | Payer: Self-pay | Source: Ambulatory Visit | Attending: Urology | Admitting: Urology

## 2020-03-17 ENCOUNTER — Encounter (HOSPITAL_COMMUNITY): Payer: Self-pay

## 2020-03-17 DIAGNOSIS — Z01818 Encounter for other preprocedural examination: Secondary | ICD-10-CM | POA: Insufficient documentation

## 2020-03-17 HISTORY — DX: Personal history of urinary calculi: Z87.442

## 2020-03-17 HISTORY — DX: Nonrheumatic aortic (valve) insufficiency: I35.1

## 2020-03-17 HISTORY — DX: Gastro-esophageal reflux disease without esophagitis: K21.9

## 2020-03-17 HISTORY — DX: Essential (primary) hypertension: I10

## 2020-03-17 LAB — CBC
HCT: 49.1 % (ref 39.0–52.0)
Hemoglobin: 16.6 g/dL (ref 13.0–17.0)
MCH: 32.1 pg (ref 26.0–34.0)
MCHC: 33.8 g/dL (ref 30.0–36.0)
MCV: 95 fL (ref 80.0–100.0)
Platelets: 248 10*3/uL (ref 150–400)
RBC: 5.17 MIL/uL (ref 4.22–5.81)
RDW: 12 % (ref 11.5–15.5)
WBC: 6.7 10*3/uL (ref 4.0–10.5)
nRBC: 0 % (ref 0.0–0.2)

## 2020-03-17 LAB — BASIC METABOLIC PANEL
Anion gap: 5 (ref 5–15)
BUN: 15 mg/dL (ref 6–20)
CO2: 29 mmol/L (ref 22–32)
Calcium: 8.7 mg/dL — ABNORMAL LOW (ref 8.9–10.3)
Chloride: 103 mmol/L (ref 98–111)
Creatinine, Ser: 1.05 mg/dL (ref 0.61–1.24)
GFR calc Af Amer: >60 mL/min (ref >60)
GFR calc non Af Amer: >60 mL/min (ref >60)
Glucose, Bld: 102 mg/dL — ABNORMAL HIGH (ref 70–99)
Potassium: 4.3 mmol/L (ref 3.5–5.1)
Sodium: 137 mmol/L (ref 135–145)

## 2020-03-17 LAB — PROTIME-INR
INR: 1 (ref 0.8–1.2)
Prothrombin Time: 13.1 seconds (ref 11.4–15.2)

## 2020-03-17 NOTE — Progress Notes (Addendum)
PCP - Lady Deutscher , FNP Cardiologist - Marlin Canary PA lov 10-23-19   PPM/ICD -  Device Orders -  Rep Notified -   Chest x-ray -  EKG - 03-17-20 Stress Test -  ECHO -  Cardiac Cath -   Sleep Study -  CPAP -   Fasting Blood Sugar -  Checks Blood Sugar _____ times a day  Blood Thinner Instructions: Aspirin Instructions:  ERAS Protcol - PRE-SURGERY Ensure or G2-   COVID TEST-   Activity- Workouts every day , Has job working outside Anesthesia review: Moderate AI, HTN, Cardiomyopathy recovered follow up visit per last cards OVN  Patient denies shortness of breath, fever, cough and chest pain at PAT appointment  NONE   All instructions explained to the patient, with a verbal understanding of the material. Patient agrees to go over the instructions while at home for a better understanding. Patient also instructed to self quarantine after being tested for COVID-19. The opportunity to ask questions was provided.

## 2020-03-23 NOTE — Progress Notes (Signed)
Anesthesia Chart Review   Case: [id number] Date/Time: 03/29/20 1245   Procedure: RIGHT NEPHROLITHOTOMY PERCUTANEOUS (Right )   Anesthesia type: General   Pre-op diagnosis: RIGHT RENAL STONE   Location: [facility] ROOM 06 / [facility]   Surgeons: Crista Elliot, MD      DISCUSSION:59 y.o. former smoker (quit 08/28/00) with h/o HTN, GERD, nonischemic cardiomyopathy (resolved, EF 50-55% on echo 10/218), right renal stone scheduled for above procedure 03/29/2020 with Dr. Modena Slater.   Echo 05/2017: LVEF 50-55%. 1-2+ AI.   R/LHC 03/28/16: Normal coronaries, LVEDP 30, RA mean 21, RV 25/17/17, PA 45/27/34, PCWP mean 29. Fick CO/CI 2.5/1.2. IABP was placed for mechanical support.  Pt followed by [facility], HF clinic.  Last seen 10/23/2019.  Per OV note recovered CM.  Pt asymptomatic, stays very active.  1 year follow up recommended.  VS: BP 137/82   Pulse 70   Temp 36.5 C (Oral)   Resp 16   Ht 6' (1.829 m)   Wt 93 kg   SpO2 98%   BMI 27.80 kg/m   PROVIDERS: Ward, Clois Comber, FNP is PCP   Marlin Canary, MD is Cardiologist with [facility] LABS: Labs reviewed: Acceptable for surgery. (all labs ordered are listed, but only abnormal results are displayed)  Labs Reviewed  BASIC METABOLIC PANEL - Abnormal; Notable for the following components:      Result Value   Glucose, Bld 102 (*)    Calcium 8.7 (*)    All other components within normal limits  CBC  PROTIME-INR  TYPE AND SCREEN     IMAGES:   EKG: 03/17/20 Rate 68 bpm  NSR  CV: Echo 06/13/2017 (on chart) Interpretation Summary:  A complete two-dimensional transthoracic echocardiogram with color flow Doppler and spectral Doppler was performed.  A three-dimensional acquisition was performed.  The left ventricle is normal in size.   There is nomral left ventricular wall thickness.  Grade I mild diastolic dysfunction; abnormal relaxation pattern The left ventricular ejection fraction is normal (50-55%).   The left ventricular wall motion is normal The right ventricle is normal in structure and function  The left and right atria are normal size There is mild to moderate 1-2+ aortic regurgitation present Mild aortic sclerosis is present with good valvular opening.  The aortic root is normal in diameter There is no pericardial effusion.  Past Medical History:  Diagnosis Date  . Aortic insufficiency   . Cardiomyopathy (HCC)   . Enlarged heart   . GERD (gastroesophageal reflux disease)   . History of kidney stones   . History of myocarditis   . Hypertension     Past Surgical History:  Procedure Laterality Date  . HEMORROIDECTOMY    . HERNIA REPAIR      MEDICATIONS: . aspirin EC 81 MG tablet  . carvedilol (COREG) 12.5 MG tablet  . cetirizine (ZYRTEC) 10 MG tablet  . furosemide (LASIX) 20 MG tablet  . losartan (COZAAR) 25 MG tablet  . tamsulosin (FLOMAX) 0.4 MG CAPS capsule   No current facility-administered medications for this encounter.     Jodell Cipro, PA-C [facility] Pre-Surgical Testing [phone]

## 2020-03-25 ENCOUNTER — Other Ambulatory Visit: Payer: Self-pay | Admitting: Radiology

## 2020-03-25 ENCOUNTER — Other Ambulatory Visit (HOSPITAL_COMMUNITY)
Admission: RE | Admit: 2020-03-25 | Discharge: 2020-03-25 | Disposition: A | Discharge status: Home / Self Care | Payer: HRSA Program | Source: Ambulatory Visit | Attending: Urology | Admitting: Urology

## 2020-03-25 DIAGNOSIS — Z01812 Encounter for preprocedural laboratory examination: Secondary | ICD-10-CM | POA: Diagnosis present

## 2020-03-25 DIAGNOSIS — Z20822 Contact with and (suspected) exposure to covid-19: Secondary | ICD-10-CM | POA: Diagnosis not present

## 2020-03-25 LAB — SARS CORONAVIRUS 2 (TAT 6-24 HRS): SARS Coronavirus 2: NEGATIVE

## 2020-03-26 ENCOUNTER — Other Ambulatory Visit: Payer: Self-pay | Admitting: Radiology

## 2020-03-29 ENCOUNTER — Ambulatory Visit (HOSPITAL_COMMUNITY): Payer: Self-pay | Admitting: Physician Assistant

## 2020-03-29 ENCOUNTER — Ambulatory Visit (HOSPITAL_COMMUNITY)
Admission: RE | Admit: 2020-03-29 | Discharge: 2020-03-29 | Disposition: A | Discharge status: Home / Self Care | Payer: Self-pay | Source: Ambulatory Visit | Attending: Urology | Admitting: Urology

## 2020-03-29 ENCOUNTER — Observation Stay (HOSPITAL_COMMUNITY): Payer: Self-pay

## 2020-03-29 ENCOUNTER — Encounter (HOSPITAL_COMMUNITY): Payer: Self-pay | Admitting: Urology

## 2020-03-29 ENCOUNTER — Observation Stay (HOSPITAL_COMMUNITY)
Admission: RE | Admit: 2020-03-29 | Discharge: 2020-03-30 | Disposition: A | Discharge status: Home / Self Care | Payer: Self-pay | Attending: Urology | Admitting: Urology

## 2020-03-29 ENCOUNTER — Encounter (HOSPITAL_COMMUNITY)
Admission: RE | Disposition: A | Discharge status: Home / Self Care | Payer: Self-pay | Source: Home / Self Care | Attending: Urology

## 2020-03-29 ENCOUNTER — Ambulatory Visit (HOSPITAL_COMMUNITY): Payer: Self-pay | Admitting: Anesthesiology

## 2020-03-29 ENCOUNTER — Other Ambulatory Visit: Payer: Self-pay

## 2020-03-29 DIAGNOSIS — Z7982 Long term (current) use of aspirin: Secondary | ICD-10-CM | POA: Insufficient documentation

## 2020-03-29 DIAGNOSIS — N23 Unspecified renal colic: Secondary | ICD-10-CM | POA: Insufficient documentation

## 2020-03-29 DIAGNOSIS — N200 Calculus of kidney: Secondary | ICD-10-CM

## 2020-03-29 HISTORY — PX: IR URETERAL STENT RIGHT NEW ACCESS W/O SEP NEPHROSTOMY CATH: IMG6076

## 2020-03-29 HISTORY — PX: NEPHROLITHOTOMY: SHX5134

## 2020-03-29 LAB — CBC WITH DIFFERENTIAL/PLATELET
Abs Immature Granulocytes: 0.03 10*3/uL (ref 0.00–0.07)
Basophils Absolute: 0.1 10*3/uL (ref 0.0–0.1)
Basophils Relative: 2 %
Eosinophils Absolute: 0.3 10*3/uL (ref 0.0–0.5)
Eosinophils Relative: 5 %
HCT: 47.3 % (ref 39.0–52.0)
Hemoglobin: 16 g/dL (ref 13.0–17.0)
Immature Granulocytes: 1 %
Lymphocytes Relative: 29 %
Lymphs Abs: 1.5 10*3/uL (ref 0.7–4.0)
MCH: 31.5 pg (ref 26.0–34.0)
MCHC: 33.8 g/dL (ref 30.0–36.0)
MCV: 93.1 fL (ref 80.0–100.0)
Monocytes Absolute: 0.7 10*3/uL (ref 0.1–1.0)
Monocytes Relative: 13 %
Neutro Abs: 2.7 10*3/uL (ref 1.7–7.7)
Neutrophils Relative %: 50 %
Platelets: 193 10*3/uL (ref 150–400)
RBC: 5.08 MIL/uL (ref 4.22–5.81)
RDW: 12.3 % (ref 11.5–15.5)
WBC: 5.3 10*3/uL (ref 4.0–10.5)
nRBC: 0 % (ref 0.0–0.2)

## 2020-03-29 LAB — BASIC METABOLIC PANEL
Anion gap: 10 (ref 5–15)
BUN: 13 mg/dL (ref 6–20)
CO2: 23 mmol/L (ref 22–32)
Calcium: 8.5 mg/dL — ABNORMAL LOW (ref 8.9–10.3)
Chloride: 105 mmol/L (ref 98–111)
Creatinine, Ser: 1.13 mg/dL (ref 0.61–1.24)
GFR calc Af Amer: >60 mL/min (ref >60)
GFR calc non Af Amer: >60 mL/min (ref >60)
Glucose, Bld: 96 mg/dL (ref 70–99)
Potassium: 4.2 mmol/L (ref 3.5–5.1)
Sodium: 138 mmol/L (ref 135–145)

## 2020-03-29 LAB — ABO/RH: ABO/RH(D): A POS

## 2020-03-29 LAB — TYPE AND SCREEN
ABO/RH(D): A POS
Antibody Screen: NEGATIVE

## 2020-03-29 SURGERY — NEPHROLITHOTOMY PERCUTANEOUS
Anesthesia: General | Laterality: Right

## 2020-03-29 MED ORDER — BACITRACIN-NEOMYCIN-POLYMYXIN 400-5-5000 EX OINT: 1.0000 "application " | TOPICAL_OINTMENT | Freq: Three times a day (TID) | CUTANEOUS | Status: DC | PRN

## 2020-03-29 MED ORDER — ACETAMINOPHEN 325 MG PO TABS: 650.0000 mg | ORAL_TABLET | ORAL | Status: DC | PRN

## 2020-03-29 MED ORDER — DIPHENHYDRAMINE HCL 50 MG/ML IJ SOLN: 12.5000 mg | Freq: Four times a day (QID) | INTRAMUSCULAR | Status: DC | PRN

## 2020-03-29 MED ORDER — PHENYLEPHRINE 40 MCG/ML (10ML) SYRINGE FOR IV PUSH (FOR BLOOD PRESSURE SUPPORT)
PREFILLED_SYRINGE | INTRAVENOUS | Status: DC | PRN
  Administered 2020-03-29: 40 ug via INTRAVENOUS
  Administered 2020-03-29: 80 ug via INTRAVENOUS
  Administered 2020-03-29 (×2): 40 ug via INTRAVENOUS

## 2020-03-29 MED ORDER — CEFAZOLIN SODIUM-DEXTROSE 2-4 GM/100ML-% IV SOLN
2.0000 g | Freq: Three times a day (TID) | INTRAVENOUS | Status: AC
  Administered 2020-03-29 – 2020-03-30 (×2): 2 g via INTRAVENOUS
  Filled 2020-03-29 (×2): qty 100

## 2020-03-29 MED ORDER — CHLORHEXIDINE GLUCONATE CLOTH 2 % EX PADS: 6.0000 | MEDICATED_PAD | Freq: Every day | CUTANEOUS | Status: DC

## 2020-03-29 MED ORDER — LIDOCAINE-EPINEPHRINE 1 %-1:100000 IJ SOLN
INTRAMUSCULAR | Status: AC | PRN
  Administered 2020-03-29: 10 mL

## 2020-03-29 MED ORDER — FENTANYL CITRATE (PF) 100 MCG/2ML IJ SOLN
INTRAMUSCULAR | Status: AC
  Filled 2020-03-29: qty 2

## 2020-03-29 MED ORDER — IOHEXOL 300 MG/ML  SOLN
10.0000 mL | Freq: Once | INTRAMUSCULAR | Status: AC | PRN
  Administered 2020-03-29: 10 mL

## 2020-03-29 MED ORDER — LACTATED RINGERS IV SOLN: INTRAVENOUS | Status: DC

## 2020-03-29 MED ORDER — HYDROMORPHONE HCL 1 MG/ML IJ SOLN
INTRAMUSCULAR | Status: AC
  Administered 2020-03-29: 0.5 mg via INTRAVENOUS
  Filled 2020-03-29: qty 1

## 2020-03-29 MED ORDER — ROCURONIUM BROMIDE 10 MG/ML (PF) SYRINGE
PREFILLED_SYRINGE | INTRAVENOUS | Status: DC | PRN
  Administered 2020-03-29: 80 mg via INTRAVENOUS

## 2020-03-29 MED ORDER — OXYBUTYNIN CHLORIDE 5 MG PO TABS: 5.0000 mg | ORAL_TABLET | Freq: Three times a day (TID) | ORAL | Status: DC | PRN

## 2020-03-29 MED ORDER — DEXAMETHASONE SODIUM PHOSPHATE 10 MG/ML IJ SOLN
INTRAMUSCULAR | Status: AC
  Filled 2020-03-29: qty 1

## 2020-03-29 MED ORDER — MIDAZOLAM HCL 2 MG/2ML IJ SOLN
INTRAMUSCULAR | Status: AC
  Filled 2020-03-29: qty 6

## 2020-03-29 MED ORDER — SODIUM CHLORIDE 0.9 % IR SOLN
Status: DC | PRN
  Administered 2020-03-29: 12000 mL

## 2020-03-29 MED ORDER — SENNA 8.6 MG PO TABS
1.0000 | ORAL_TABLET | Freq: Two times a day (BID) | ORAL | Status: DC
  Administered 2020-03-29 – 2020-03-30 (×2): 8.6 mg via ORAL
  Filled 2020-03-29 (×2): qty 1

## 2020-03-29 MED ORDER — OXYCODONE HCL 5 MG PO TABS
5.0000 mg | ORAL_TABLET | ORAL | Status: DC | PRN
  Administered 2020-03-29 – 2020-03-30 (×2): 5 mg via ORAL
  Filled 2020-03-29 (×2): qty 1

## 2020-03-29 MED ORDER — LIDOCAINE 2% (20 MG/ML) 5 ML SYRINGE
INTRAMUSCULAR | Status: AC
  Filled 2020-03-29: qty 5

## 2020-03-29 MED ORDER — FENTANYL CITRATE (PF) 100 MCG/2ML IJ SOLN
INTRAMUSCULAR | Status: AC
  Filled 2020-03-29: qty 4

## 2020-03-29 MED ORDER — PROPOFOL 10 MG/ML IV BOLUS
INTRAVENOUS | Status: DC | PRN
  Administered 2020-03-29: 150 mg via INTRAVENOUS

## 2020-03-29 MED ORDER — CEFAZOLIN SODIUM-DEXTROSE 2-4 GM/100ML-% IV SOLN
INTRAVENOUS | Status: AC
  Administered 2020-03-29: 2000 mg
  Filled 2020-03-29: qty 100

## 2020-03-29 MED ORDER — CHLORHEXIDINE GLUCONATE 0.12 % MT SOLN
15.0000 mL | Freq: Once | OROMUCOSAL | Status: AC
  Administered 2020-03-29: 15 mL via OROMUCOSAL

## 2020-03-29 MED ORDER — LORATADINE 10 MG PO TABS
10.0000 mg | ORAL_TABLET | Freq: Every day | ORAL | Status: DC
  Administered 2020-03-30: 10 mg via ORAL
  Filled 2020-03-29: qty 1

## 2020-03-29 MED ORDER — CARVEDILOL 12.5 MG PO TABS
12.5000 mg | ORAL_TABLET | Freq: Two times a day (BID) | ORAL | Status: DC
  Administered 2020-03-29 – 2020-03-30 (×2): 12.5 mg via ORAL
  Filled 2020-03-29 (×2): qty 1

## 2020-03-29 MED ORDER — FENTANYL CITRATE (PF) 250 MCG/5ML IJ SOLN
INTRAMUSCULAR | Status: DC | PRN
  Administered 2020-03-29 (×2): 50 ug via INTRAVENOUS

## 2020-03-29 MED ORDER — FUROSEMIDE 20 MG PO TABS: 10.0000 mg | ORAL_TABLET | Freq: Every day | ORAL | Status: DC | PRN

## 2020-03-29 MED ORDER — FENTANYL CITRATE (PF) 100 MCG/2ML IJ SOLN
INTRAMUSCULAR | Status: AC | PRN
  Administered 2020-03-29 (×2): 50 ug via INTRAVENOUS

## 2020-03-29 MED ORDER — DOCUSATE SODIUM 100 MG PO CAPS
100.0000 mg | ORAL_CAPSULE | Freq: Two times a day (BID) | ORAL | Status: DC
  Administered 2020-03-29 – 2020-03-30 (×2): 100 mg via ORAL
  Filled 2020-03-29 (×2): qty 1

## 2020-03-29 MED ORDER — LIDOCAINE 2% (20 MG/ML) 5 ML SYRINGE
INTRAMUSCULAR | Status: DC | PRN
  Administered 2020-03-29: 50 mg via INTRAVENOUS
  Administered 2020-03-29: 40 mg via INTRAVENOUS

## 2020-03-29 MED ORDER — ONDANSETRON HCL 4 MG/2ML IJ SOLN: 4.0000 mg | INTRAMUSCULAR | Status: DC | PRN

## 2020-03-29 MED ORDER — HYDROMORPHONE HCL 1 MG/ML IJ SOLN
0.2500 mg | INTRAMUSCULAR | Status: DC | PRN
  Administered 2020-03-29: 0.5 mg via INTRAVENOUS

## 2020-03-29 MED ORDER — HYDROCODONE-ACETAMINOPHEN 5-325 MG PO TABS: 1.0000 | ORAL_TABLET | ORAL | 0 refills | Status: AC | PRN

## 2020-03-29 MED ORDER — ONDANSETRON HCL 4 MG/2ML IJ SOLN
INTRAMUSCULAR | Status: AC
  Filled 2020-03-29: qty 2

## 2020-03-29 MED ORDER — EPHEDRINE SULFATE-NACL 50-0.9 MG/10ML-% IV SOSY
PREFILLED_SYRINGE | INTRAVENOUS | Status: DC | PRN
  Administered 2020-03-29: 5 mg via INTRAVENOUS
  Administered 2020-03-29: 10 mg via INTRAVENOUS
  Administered 2020-03-29: 5 mg via INTRAVENOUS

## 2020-03-29 MED ORDER — SODIUM CHLORIDE 0.9 % IV SOLN: INTRAVENOUS | Status: DC

## 2020-03-29 MED ORDER — TAMSULOSIN HCL 0.4 MG PO CAPS
0.4000 mg | ORAL_CAPSULE | Freq: Every day | ORAL | Status: DC
  Administered 2020-03-29 – 2020-03-30 (×2): 0.4 mg via ORAL
  Filled 2020-03-29 (×2): qty 1

## 2020-03-29 MED ORDER — ROCURONIUM BROMIDE 10 MG/ML (PF) SYRINGE
PREFILLED_SYRINGE | INTRAVENOUS | Status: AC
  Filled 2020-03-29: qty 10

## 2020-03-29 MED ORDER — SUGAMMADEX SODIUM 200 MG/2ML IV SOLN
INTRAVENOUS | Status: DC | PRN
  Administered 2020-03-29: 200 mg via INTRAVENOUS

## 2020-03-29 MED ORDER — ORAL CARE MOUTH RINSE: 15.0000 mL | Freq: Once | OROMUCOSAL | Status: AC

## 2020-03-29 MED ORDER — LOSARTAN POTASSIUM 25 MG PO TABS
25.0000 mg | ORAL_TABLET | Freq: Every day | ORAL | Status: DC
  Administered 2020-03-29: 25 mg via ORAL
  Filled 2020-03-29: qty 1

## 2020-03-29 MED ORDER — MORPHINE SULFATE (PF) 2 MG/ML IV SOLN
2.0000 mg | INTRAVENOUS | Status: DC | PRN
  Administered 2020-03-29 (×2): 2 mg via INTRAVENOUS
  Administered 2020-03-30: 4 mg via INTRAVENOUS
  Filled 2020-03-29: qty 1
  Filled 2020-03-29: qty 2
  Filled 2020-03-29: qty 1

## 2020-03-29 MED ORDER — DIPHENHYDRAMINE HCL 12.5 MG/5ML PO ELIX: 12.5000 mg | ORAL_SOLUTION | Freq: Four times a day (QID) | ORAL | Status: DC | PRN

## 2020-03-29 MED ORDER — CEFAZOLIN SODIUM-DEXTROSE 2-4 GM/100ML-% IV SOLN: 2.0000 g | INTRAVENOUS | Status: DC

## 2020-03-29 MED ORDER — DEXAMETHASONE SODIUM PHOSPHATE 10 MG/ML IJ SOLN
INTRAMUSCULAR | Status: DC | PRN
  Administered 2020-03-29: 4 mg via INTRAVENOUS

## 2020-03-29 MED ORDER — ZOLPIDEM TARTRATE 5 MG PO TABS
5.0000 mg | ORAL_TABLET | Freq: Every evening | ORAL | Status: DC | PRN
  Administered 2020-03-29: 5 mg via ORAL
  Filled 2020-03-29: qty 1

## 2020-03-29 MED ORDER — ONDANSETRON HCL 4 MG/2ML IJ SOLN
INTRAMUSCULAR | Status: DC | PRN
  Administered 2020-03-29: 4 mg via INTRAVENOUS

## 2020-03-29 MED ORDER — PROPOFOL 10 MG/ML IV BOLUS
INTRAVENOUS | Status: AC
  Filled 2020-03-29: qty 20

## 2020-03-29 MED ORDER — IOHEXOL 300 MG/ML  SOLN
INTRAMUSCULAR | Status: DC | PRN
  Administered 2020-03-29: 7 mL

## 2020-03-29 MED ORDER — MIDAZOLAM HCL 2 MG/2ML IJ SOLN
INTRAMUSCULAR | Status: AC | PRN
  Administered 2020-03-29 (×2): 1 mg via INTRAVENOUS
  Administered 2020-03-29: 2 mg via INTRAVENOUS

## 2020-03-29 MED ORDER — BELLADONNA ALKALOIDS-OPIUM 16.2-60 MG RE SUPP: 1.0000 | Freq: Four times a day (QID) | RECTAL | Status: DC | PRN

## 2020-03-29 MED ORDER — LIDOCAINE-EPINEPHRINE 1 %-1:100000 IJ SOLN
INTRAMUSCULAR | Status: AC
  Filled 2020-03-29: qty 1

## 2020-03-29 SURGICAL SUPPLY — 47 items
BAG URINE DRAIN 2000ML AR STRL (UROLOGICAL SUPPLIES) IMPLANT
BASKET ZERO TIP NITINOL 2.4FR (BASKET) ×3 IMPLANT
BENZOIN TINCTURE PRP APPL 2/3 (GAUZE/BANDAGES/DRESSINGS) ×3 IMPLANT
BLADE SURG 15 STRL LF DISP TIS (BLADE) ×1 IMPLANT
BLADE SURG 15 STRL SS (BLADE) ×2
CATH FOLEY 2W COUNCIL 20FR 5CC (CATHETERS) IMPLANT
CATH ROBINSON RED A/P 20FR (CATHETERS) IMPLANT
CATH URET DUAL LUMEN 6-10FR 50 (CATHETERS) ×3 IMPLANT
CATH X-FORCE N30 NEPHROSTOMY (TUBING) ×3 IMPLANT
CHLORAPREP W/TINT 26 (MISCELLANEOUS) ×3 IMPLANT
COVER SURGICAL LIGHT HANDLE (MISCELLANEOUS) IMPLANT
COVER WAND RF STERILE (DRAPES) IMPLANT
DRAPE C-ARM 42X120 X-RAY (DRAPES) ×3 IMPLANT
DRAPE LINGEMAN PERC (DRAPES) ×3 IMPLANT
DRAPE SURG IRRIG POUCH 19X23 (DRAPES) ×3 IMPLANT
DRSG PAD ABDOMINAL 8X10 ST (GAUZE/BANDAGES/DRESSINGS) ×6 IMPLANT
DRSG TEGADERM 4X4.75 (GAUZE/BANDAGES/DRESSINGS) ×3 IMPLANT
DRSG TEGADERM 8X12 (GAUZE/BANDAGES/DRESSINGS) IMPLANT
GAUZE SPONGE 4X4 12PLY STRL (GAUZE/BANDAGES/DRESSINGS) ×3 IMPLANT
GLOVE BIO SURGEON STRL SZ7.5 (GLOVE) ×3 IMPLANT
GOWN STRL REUS W/TWL XL LVL3 (GOWN DISPOSABLE) ×3 IMPLANT
GUIDEWIRE AMPLAZ .035X145 (WIRE) ×6 IMPLANT
KIT BASIN OR (CUSTOM PROCEDURE TRAY) ×3 IMPLANT
KIT PROBE TRILOGY 3.9X350 (MISCELLANEOUS) ×3 IMPLANT
KIT TURNOVER KIT A (KITS) IMPLANT
LASER FIB FLEXIVA PULSE ID 365 (Laser) IMPLANT
MANIFOLD NEPTUNE II (INSTRUMENTS) ×3 IMPLANT
NS IRRIG 1000ML POUR BTL (IV SOLUTION) ×3 IMPLANT
PACK CYSTO (CUSTOM PROCEDURE TRAY) ×3 IMPLANT
SPONGE LAP 4X18 RFD (DISPOSABLE) ×3 IMPLANT
STENT ENDOURETEROTOMY 7-14 26C (STENTS) IMPLANT
STENT URET 6FRX26 CONTOUR (STENTS) ×3 IMPLANT
SUT CHROMIC 3 0 SH 27 (SUTURE) IMPLANT
SUT SILK 2 0 30  PSL (SUTURE)
SUT SILK 2 0 30 PSL (SUTURE) IMPLANT
SYR 10ML LL (SYRINGE) ×3 IMPLANT
SYR 20ML LL LF (SYRINGE) ×6 IMPLANT
TOWEL OR 17X26 10 PK STRL BLUE (TOWEL DISPOSABLE) ×3 IMPLANT
TOWEL OR NON WOVEN STRL DISP B (DISPOSABLE) ×3 IMPLANT
TRACTIP FLEXIVA PULS ID 200XHI (Laser) IMPLANT
TRACTIP FLEXIVA PULSE ID 200 (Laser)
TRAY FOLEY MTR SLVR 16FR STAT (SET/KITS/TRAYS/PACK) ×3 IMPLANT
TUBING CONNECTING 10 (TUBING) ×4 IMPLANT
TUBING CONNECTING 10' (TUBING) ×2
TUBING STONE CATCHER TRILOGY (MISCELLANEOUS) ×3 IMPLANT
TUBING UROLOGY SET (TUBING) ×3 IMPLANT
WATER STERILE IRR 1000ML POUR (IV SOLUTION) ×3 IMPLANT

## 2020-03-29 NOTE — Consult Note (Signed)
Chief Complaint: Patient was seen in consultation today for right percutaneous nephrostomy/nephroureteral catheter placement  Referring Physician(s): Bell,E  Supervising Physician: Malachy Moan  Patient Status: [facility] - Out-pt  TBA  History of Present Illness: Andre Davis is a 59 y.o. male with past medical history of aortic insufficiency, cardiomyopathy, GERD, myocarditis, hypertension and nephrolithiasis.  Latest CT done on 02/12/2020 revealed large stone within the right renal pelvis measuring 1.8 cm.  He presents today for right percutaneous nephrostomy/nephroureteral catheter placement prior to nephrolithotomy.  Past Medical History:  Diagnosis Date  . Aortic insufficiency   . Cardiomyopathy (HCC)   . Enlarged heart   . GERD (gastroesophageal reflux disease)   . History of kidney stones   . History of myocarditis   . Hypertension     Past Surgical History:  Procedure Laterality Date  . HEMORROIDECTOMY    . HERNIA REPAIR      Allergies: Patient has no known allergies.  Medications: Prior to Admission medications   Medication Sig Start Date End Date Taking? Authorizing Provider  aspirin EC 81 MG tablet Take 81 mg by mouth daily.   Yes [provider]  carvedilol (COREG) 12.5 MG tablet Take 12.5 mg by mouth 2 (two) times daily. 10/05/19  Yes [provider]  cetirizine (ZYRTEC) 10 MG tablet Take 10 mg by mouth daily.   Yes [provider]  furosemide (LASIX) 20 MG tablet Take 10 mg by mouth daily as needed (fluid retention.).   Yes [provider]  losartan (COZAAR) 25 MG tablet Take 25 mg by mouth at bedtime.  10/05/19  Yes [provider]  tamsulosin (FLOMAX) 0.4 MG CAPS capsule Take 1 capsule (0.4 mg total) by mouth daily. 02/12/20  Yes Lurene Shadow, PA-C     History reviewed. No pertinent family history.  Social History   Socioeconomic History  . Marital status: Single    Spouse name: Not on file  . Number of  children: Not on file  . Years of education: Not on file  . Highest education level: Not on file  Occupational History  . Not on file  Tobacco Use  . Smoking status: Former Smoker    Years: 20.00    Quit date: 2002    Years since quitting: 19.5  . Smokeless tobacco: Never Used  Vaping Use  . Vaping Use: Never used  Substance and Sexual Activity  . Alcohol use: Yes    Comment: 2 on weekends  . Drug use: No  . Sexual activity: Yes    Birth control/protection: None  Other Topics Concern  . Not on file  Social History Narrative  . Not on file   Social Determinants of Health   Financial Resource Strain:   . Difficulty of Paying Living Expenses:   Food Insecurity:   . Worried About Programme researcher, broadcasting/film/video in the Last Year:   . Barista in the Last Year:   Transportation Needs:   . Freight forwarder (Medical):   Marland Kitchen Lack of Transportation (Non-Medical):   Physical Activity:   . Days of Exercise per Week:   . Minutes of Exercise per Session:   Stress:   . Feeling of Stress :   Social Connections:   . Frequency of Communication with Friends and Family:   . Frequency of Social Gatherings with Friends and Family:   . Attends Religious Services:   . Active Member of Clubs or Organizations:   . Attends Banker  Meetings:   Marland Kitchen Marital Status:       Review of Systems currently denies fever, headache, chest pain, dyspnea, cough, abdominal/back pain, vomiting or bleeding.  Vital Signs: BP 120/77 (BP Location: Right Arm)   Pulse 72   Temp 98 F (36.7 C) (Oral)   Resp 17   SpO2 97%   Physical Exam awake, alert.  Chest clear to auscultation bilaterally.  Heart with regular rate and rhythm.  Abdomen soft, positive bowel sounds, nontender.  No lower extremity edema.  Imaging: No results found.  Labs:  CBC: Recent Labs    03/17/20 0855 03/29/20 1014  WBC 6.7 5.3  HGB 16.6 16.0  HCT 49.1 47.3  PLT 248 193    COAGS: Recent Labs    03/17/20 0855   INR 1.0    BMP: Recent Labs    02/12/20 0928 03/17/20 0855 03/29/20 1014  NA 138 137 138  K 4.2 4.3 4.2  CL 104 103 105  CO2 28 29 23   GLUCOSE 88 102* 96  BUN 19 15 13   CALCIUM 8.7 8.7* 8.5*  CREATININE 1.17 1.05 1.13  GFRNONAA 69 >60 >60  GFRAA 80 >60 >60    LIVER FUNCTION TESTS: Recent Labs    02/12/20 0928  BILITOT 0.9  AST 13  ALT 18  PROT 6.8    TUMOR MARKERS: No results for input(s): AFPTM, CEA, CA199, CHROMGRNA in the last 8760 hours.  Assessment and Plan: 59 y.o. male with past medical history of aortic insufficiency, cardiomyopathy, GERD, myocarditis, hypertension and nephrolithiasis.  Latest CT done on 02/12/2020 revealed large stone within the right renal pelvis measuring 1.8 cm.  He presents today for right percutaneous nephrostomy/nephroureteral catheter placement prior to nephrolithotomy.Risks and benefits of right PCN placement was discussed with the patient including, but not limited to, infection, bleeding, significant bleeding causing loss or decrease in renal function or damage to adjacent structures.   All of the patient's questions were answered, patient is agreeable to proceed.  Consent signed and in chart.      Thank you for this interesting consult.  I greatly enjoyed meeting Remi Lopata and look forward to participating in their care.  A copy of this report was sent to the requesting provider on this date.  Electronically Signed: D. 02/14/2020, PA-C 03/29/2020, 11:22 AM   I spent a total of 25 minutes  in face to face in clinical consultation, greater than 50% of which was counseling/coordinating care for right percutaneous nephrostomy/nephroureteral catheter placement

## 2020-03-29 NOTE — Anesthesia Postprocedure Evaluation (Signed)
Anesthesia Post Note  Patient: Andre Davis  Procedure(s) Performed: RIGHT NEPHROLITHOTOMY PERCUTANEOUS; DIAGNOSTIC URETEROSCOPY (Right )     Patient location during evaluation: PACU Anesthesia Type: General Level of consciousness: awake Pain management: pain level controlled Vital Signs Assessment: post-procedure vital signs reviewed and stable Respiratory status: spontaneous breathing Cardiovascular status: stable Postop Assessment: no apparent nausea or vomiting Anesthetic complications: no   No complications documented.  Last Vitals:  Vitals:   03/29/20 1005  BP: 120/77  Pulse: 72  Resp: 17  Temp: 36.7 C  SpO2: 97%    Last Pain:  Vitals:   03/29/20 1005  TempSrc: Oral                 Adolfo Granieri

## 2020-03-29 NOTE — Anesthesia Procedure Notes (Signed)
Procedure Name: Intubation Date/Time: 03/29/2020 1:44 PM Performed by: Eben Burow, CRNA Pre-anesthesia Checklist: Patient identified, Emergency Drugs available, Suction available, Patient being monitored and Timeout performed Patient Re-evaluated:Patient Re-evaluated prior to induction Oxygen Delivery Method: Circle system utilized Preoxygenation: Pre-oxygenation with 100% oxygen Induction Type: IV induction Ventilation: Mask ventilation without difficulty Laryngoscope Size: Mac and 4 Grade View: Grade I Tube type: Oral Tube size: 7.5 mm Number of attempts: 2 Airway Equipment and Method: Stylet Placement Confirmation: ETT inserted through vocal cords under direct vision,  positive ETCO2 and breath sounds checked- equal and bilateral Secured at: 23 cm Tube secured with: Tape Dental Injury: Teeth and Oropharynx as per pre-operative assessment  Comments: DVL x 1 by paramedic student Darden Palmer with small pinch to bottom lip, DVL x 1 by Jefm Miles, CRNA, grade I view, +/= BBS, +ETCO2.

## 2020-03-29 NOTE — Op Note (Signed)
Operative Note  Preoperative diagnosis:  1.  Right renal calculus  Postoperative diagnosis: 1.  Right renal calculus, less than 2 cm  Procedure(s): 1.  Right percutaneous nephrolithotomy, less than 2 cm 2.  Retrograde ureteroscopy with stone extraction and stent manipulation  Surgeon: Modena Slater, MD  Assistants: None  Anesthesia: General  Complications: None immediate  EBL: 50 cc  Specimens: 1.  Renal calculus  Drains/Catheters: 1.  6 x 26 double-J ureteral stent  Intraoperative findings: 1.  1.8 cm right renal pelvis calculus as well as some scattered smaller calculi all retrieved. 2.  Normal urethra and bladder.  The distal ureter was cleared of small stone and the ureteral stent was repositioned so that the coil was within the bladder.  Fluoroscopy confirmed proximal placement.  Indication: 60 year old male with a large right renal calculus presents for the previously mentioned operation.  Description of procedure:  The patient was identified and consent was obtained.  The patient was taken to the operating room and placed in the supine position.  The patient was placed under general anesthesia.  Perioperative antibiotics were administered.  The patient was placed in prone position and all pressure points were padded.  Patient was prepped and draped in a standard sterile fashion and a timeout was performed.  A Super Stiff wire was advanced through the nephroureteral stent down to the bladder under fluoroscopic guidance and the nephroureteral stent was removed.  A dual-lumen ureteral catheter was advanced over the Super Stiff wire into the renal pelvis and an antegrade nephrostogram was performed.  This showed a well opacified kidney and a filling defect corresponding to the stone of interest.  I advanced the dual-lumen ureteral catheter into the proximal ureter under fluoroscopic guidance followed by placement of a second Super Stiff wire down to the bladder under  fluoroscopic guidance.  The dual-lumen catheter was removed.  An incision was made alongside the wires.  The balloon dilator was then advanced over one of the wires and into the renal pelvis fluoroscopic guidance and the tract was dilated to a pressure of 18.  The sheath was advanced over the balloon and into the renal pelvis.  The balloon was withdrawn keeping the sheath in place.  The nephroscope was advanced into the kidney and the stone of interest was encountered.  The stone was then removed with a combination of pneumatic and ultrasound with suction.All stone was removed and there was no evidence of any other stones within the kidney.  The nephroscope along with the sheath were withdrawn.    One of the wires was withdrawn and A 6 x 26 double-J ureteral stent was advanced over the other wire under fluoroscopic guidance and the wire was withdrawn.  Fluoroscopy showed that the stent appeared to be in the ureter and not all the way down to the bladder.  The skin incision was closed with a running 3-0 Monocryl.  We then repositioned patient into the supine position.  I removed the Foley catheter and performed flexible cystourethroscopy.  I did not visualize a stent within the bladder.  Therefore, we positioned the bladder in dorsal lithotomy position and I advanced a semirigid ureteroscope into the urethra and into the bladder and carefully up the right distal ureter.  There were some small stones that were basket extracted atraumatically.  I then identified the stent which was also in the distal ureter and I used a basket to snare the end of the stent and pull it into the bladder which then  demonstrated good coil.  Fluoroscopy confirmed proximal placement at this time.  I withdrew the scope and this concluded the operation.  The patient tolerated procedure well and was stable postoperatively.  Plan: Patient will remain under observation overnight. Stent to be removed in 2 weeks.

## 2020-03-29 NOTE — Anesthesia Preprocedure Evaluation (Addendum)
Anesthesia Evaluation  Patient identified by MRN, date of birth, ID band Patient awake    Reviewed: Allergy & Precautions, NPO status , Patient's Chart, lab work & pertinent test results, reviewed documented beta blocker date and time   Airway Mallampati: II  TM Distance: >3 FB Neck ROM: Full    Dental no notable dental hx. (+) Teeth Intact, Dental Advisory Given   Pulmonary neg pulmonary ROS, former smoker,    Pulmonary exam normal breath sounds clear to auscultation       Cardiovascular hypertension, Pt. on home beta blockers and Pt. on medications Normal cardiovascular exam+ Valvular Problems/Murmurs AI  Rhythm:Regular Rate:Normal  Echo 05/2017: LVEF 50-55%. 1-2+ AI  R/LHC 03/28/16: Normal coronaries   Neuro/Psych negative neurological ROS  negative psych ROS   GI/Hepatic GERD  ,(+)     substance abuse (remote h/o drug use)  alcohol use, cocaine use and marijuana use,   Endo/Other  negative endocrine ROS  Renal/GU negative Renal ROS  negative genitourinary   Musculoskeletal negative musculoskeletal ROS (+)   Abdominal   Peds  Hematology negative hematology ROS (+)   Anesthesia Other Findings   Reproductive/Obstetrics                          Anesthesia Physical Anesthesia Plan  ASA: III  Anesthesia Plan: General   Post-op Pain Management:    Induction: Intravenous  PONV Risk Score and Plan: 2 and Midazolam, Dexamethasone and Ondansetron  Airway Management Planned: Oral ETT  Additional Equipment:   Intra-op Plan:   Post-operative Plan: Extubation in OR  Informed Consent: I have reviewed the patients History and Physical, chart, labs and discussed the procedure including the risks, benefits and alternatives for the proposed anesthesia with the patient or authorized representative who has indicated his/her understanding and acceptance.     Dental advisory given  Plan  Discussed with: CRNA  Anesthesia Plan Comments:         Anesthesia Quick Evaluation

## 2020-03-29 NOTE — Transfer of Care (Signed)
Immediate Anesthesia Transfer of Care Note  Patient: Andre Davis  Procedure(s) Performed: RIGHT NEPHROLITHOTOMY PERCUTANEOUS; DIAGNOSTIC URETEROSCOPY (Right )  Patient Location: PACU  Anesthesia Type:General  Level of Consciousness: awake, alert , oriented and patient cooperative  Airway & Oxygen Therapy: Patient Spontanous Breathing and Patient connected to face mask oxygen  Post-op Assessment: Report given to RN, Post -op Vital signs reviewed and stable and Patient moving all extremities  Post vital signs: Reviewed and stable  Last Vitals:  Vitals Value Taken Time  BP    Temp    Pulse 85 03/29/20 1550  Resp 14 03/29/20 1550  SpO2 100 % 03/29/20 1550  Vitals shown include unvalidated device data.  Last Pain:  Vitals:   03/29/20 1005  TempSrc: Oral         Complications: No complications documented.

## 2020-03-29 NOTE — H&P (Signed)
CC/HPI: CC: Right renal calculus  HPI:  02/12/2020  59 year old male presents with right-sided flank pain. He had a CT scan performed today that showed a 1.8 cm right renal pelvis stone with some large adjacent stones. There were also several small stones in the left kidney measuring up to 4 mm. He has had kidney stones before. He has had lithotripsy in the past. He denies any fever, nausea, vomiting. Pain is currently controlled.     ALLERGIES: None   MEDICATIONS: Aspirin 81 mg tablet,chewable  Carvedilol  Losartan Potassium     GU PSH: None   NON-GU PSH: Hemorrhoidectomy Incisional hernia repair (open), Left - 2010     GU PMH: Renal calculus    NON-GU PMH: GERD Heart failure, unspecified Sleep Apnea    FAMILY HISTORY: DVT of leg (deep venous thrombosis) - Father   SOCIAL HISTORY: Marital Status: Single Preferred Language: English; Race: White Current Smoking Status: Patient does not smoke anymore. Has not smoked since 01/26/2009.   Tobacco Use Assessment Completed: Used Tobacco in last 30 days? Drinks 3 drinks per week.  Drinks 1 caffeinated drink per day. Patient's occupation Museum/gallery conservator.    REVIEW OF SYSTEMS:    GU Review Male:   Patient reports frequent urination, get up at night to urinate, and erection problems. Patient denies hard to postpone urination, burning/ pain with urination, leakage of urine, stream starts and stops, trouble starting your stream, have to strain to urinate , and penile pain.  Gastrointestinal (Upper):   Patient denies nausea, vomiting, and indigestion/ heartburn.  Gastrointestinal (Lower):   Patient denies diarrhea and constipation.  Constitutional:   Patient denies fever, night sweats, weight loss, and fatigue.  Skin:   Patient denies skin rash/ lesion and itching.  Eyes:   Patient denies blurred vision and double vision.  Ears/ Nose/ Throat:   Patient denies sore throat and sinus problems.  Hematologic/Lymphatic:    Patient denies swollen glands and easy bruising.  Cardiovascular:   Patient denies leg swelling and chest pains.  Respiratory:   Patient denies cough and shortness of breath.  Endocrine:   Patient denies excessive thirst.  Musculoskeletal:   Patient denies back pain and joint pain.  Neurological:   Patient denies headaches and dizziness.  Psychologic:   Patient denies depression and anxiety.   VITAL SIGNS:      02/12/2020 12:38 PM  Weight 200 lb / 90.72 kg  Height 72 in / 182.88 cm  BP 134/81 mmHg  Heart Rate 83 /min  Temperature 97.8 F / 36.5 C  BMI 27.1 kg/m   MULTI-SYSTEM PHYSICAL EXAMINATION:    Constitutional: Well-nourished. No physical deformities. Normally developed. Good grooming.  Respiratory: No labored breathing, no use of accessory muscles.   Cardiovascular: Normal temperature, normal extremity pulses, no swelling, no varicosities.  Skin: No paleness, no jaundice, no cyanosis. No lesion, no ulcer, no rash.  Neurologic / Psychiatric: Oriented to time, oriented to place, oriented to person. No depression, no anxiety, no agitation.  Gastrointestinal: No mass, no tenderness, no rigidity, non obese abdomen. No CVA tenderness  Eyes: Normal conjunctivae. Normal eyelids.  Musculoskeletal: Normal gait and station of head and neck.     Complexity of Data:  Records Review:   Previous Doctor Records, Previous Patient Records  Urine Test Review:   Urinalysis  X-Ray Review: C.T. Abdomen/Pelvis: Reviewed Films. Reviewed Report. Discussed With Patient.     PROCEDURES: None   ASSESSMENT:      ICD-10 Details  1  GU:   Renal calculus - N20.0 Undiagnosed New Problem  2   Renal colic - N23 Undiagnosed New Problem   PLAN:           Document Letter(s):  Created for Patient: Clinical Summary         Notes:   We discussed the management of urinary stones. These options include observation, ureteroscopy, shockwave lithotripsy, and PCNL. We discussed which options are relevant to  these particular stones. We discussed the natural history of stones as well as the complications of untreated stones and the impact on quality of life without treatment as well as with each of the above listed treatments. We also discussed the efficacy of each treatment in its ability to clear the stone burden. With any of these management options I discussed the signs and symptoms of infection and the need for emergent treatment should these be experienced. For each option we discussed the ability of each procedure to clear the patient of their stone burden.   For observation I described the risks which include but are not limited to silent renal damage, life-threatening infection, need for emergent surgery, failure to pass stone, and pain.   For ureteroscopy I described the risks which include heart attack, stroke, pulmonary embolus, death, bleeding, infection, damage to contiguous structures, positioning injury, ureteral stricture, ureteral avulsion, ureteral injury, need for ureteral stent, inability to perform ureteroscopy, need for an interval procedure, inability to clear stone burden, stent discomfort and pain.   For shockwave lithotripsy I described the risks which include arrhythmia, kidney contusion, kidney hemorrhage, need for transfusion, pain, inability to break up stone, inability to pass stone fragments, Steinstrasse, infection associated with obstructing stones, need for different surgical procedure, need for repeat shockwave lithotripsy, and death.   For PCNL I described the risks including heart attack, pulmonary embolus, death, positioning injury, pneumothorax, hydrothorax, need for chest tube, inability to clear stone burden, renal laceration, arterial venous fistula or malformation, need for embolization of kidney, loss of kidney or renal function, need for repeat procedure, need for prolonged nephrostomy tube, ureteral avulsion.   He would like to proceed with right PCNL.   Signed  by Modena Slater, III, M.D. on 02/12/20 at 1:24 PM (EDT

## 2020-03-29 NOTE — Plan of Care (Signed)
Patient had book provided. Bed alarm set. Call bell and All belongings have been placed within reach.

## 2020-03-29 NOTE — Discharge Instructions (Signed)
Discharge instructions following PCNL ° °Call your doctor for: °Fevers greater than 100.5 °Severe nausea or vomiting °Increasing pain not controlled by pain medication °Increasing redness or drainage from incisions °Decreased urine output or a catheter is no longer draining ° °The number for questions is [phone]. ° °Activity: °Gradually increase activity with short frequent walks, 3-4 times a day.  Avoid strenuous activities, like sports, lawn-mowing, or heavy lifting (more than 10-15 pounds).  Wear loose, comfortable clothing that pull or kink the tube or tubes.  Do not drive while taking pain medication, or until your doctor permitts it. ° °Bathing and dressing changes: °You should not shower for 48 hours after surgery.  Do not soak your back in a bathtub. ° °Drainage bag care: °You may be discharged with a drainage bag around the site of your surgery.  The drainage bag should be secured such that it never pulls or loosens to prevent it from leaking.  It is important to wash her hands before and after emptying the drainage bag to help prevent the spread of infection.  The drainage bag should be emptied as needed.  When the wound stops draining or it is manageable with a dry gauze dressing, you can remove the bag. ° °If your tube in the back was removed, you should expect to have some leakage of fluid from the back incision.  This should slowly decrease and stop over the next couple of days.  If you have severe pain or persistent leakage, please call the number above.  Otherwise, your dressing can be changed 1-2 times daily or more if needed. ° °Diet: °It is extremely important to drink plenty of fluids after surgery, especially water.  You may resume your regular diet, unless otherwise instructed. ° °Medications: °May take Tylenol (acetaminophen) or ibuprofen (Advil, Motrin) as directed over-the-counter. °Take any prescriptions as directed. ° °Follow-up appointments: °Follow-up appointment will be scheduled  with Dr. Jadi Deyarmin °

## 2020-03-30 ENCOUNTER — Encounter (HOSPITAL_COMMUNITY): Payer: Self-pay | Admitting: Urology

## 2020-03-30 LAB — BASIC METABOLIC PANEL
Anion gap: 8 (ref 5–15)
BUN: 14 mg/dL (ref 6–20)
CO2: 25 mmol/L (ref 22–32)
Calcium: 8.4 mg/dL — ABNORMAL LOW (ref 8.9–10.3)
Chloride: 102 mmol/L (ref 98–111)
Creatinine, Ser: 1.13 mg/dL (ref 0.61–1.24)
GFR calc Af Amer: >60 mL/min (ref >60)
GFR calc non Af Amer: >60 mL/min (ref >60)
Glucose, Bld: 126 mg/dL — ABNORMAL HIGH (ref 70–99)
Potassium: 4.3 mmol/L (ref 3.5–5.1)
Sodium: 135 mmol/L (ref 135–145)

## 2020-03-30 LAB — HEMOGLOBIN AND HEMATOCRIT, BLOOD
HCT: 45.3 % (ref 39.0–52.0)
Hemoglobin: 15.4 g/dL (ref 13.0–17.0)

## 2020-03-30 NOTE — Discharge Summary (Signed)
Physician Discharge Summary  Patient ID: Andre Davis MRN: [id number] DOB/AGE: Oct 16, 1960 59 y.o.  Admit date: 03/29/2020 Discharge date: 03/30/2020  Admission Diagnoses:  Discharge Diagnoses:  Active Problems:   Renal calculi   Discharged Condition: good  Hospital Course: Patient underwent a right PCNL on 03/29/2020.  He tolerated procedure well and was stable postoperative.  By the next day, he was stable and voiding.  He was discharged home in stable condition.  Consults: None  Significant Diagnostic Studies: None  Treatments: surgery: As above  Discharge Exam: Blood pressure (!) 146/84, pulse 100, temperature 97.8 F (36.6 C), temperature source Oral, resp. rate 20, height 6' (1.829 m), weight 93 kg, SpO2 93 %. General appearance: alert no acute distress Adequate perfusion of extremities Nonlabored respiration Abdomen soft, nontender, nondistended Nephrostomy tube site with dressing clean dry and intact  Disposition: Discharge disposition: 01-Home or Self Care       Discharge Instructions    No wound care   Complete by: As directed      Allergies as of 03/30/2020   No Known Allergies     Medication List    TAKE these medications   aspirin EC 81 MG tablet Take 81 mg by mouth daily. Notes to patient: 03/30/20   carvedilol 12.5 MG tablet Commonly known as: COREG Take 12.5 mg by mouth 2 (two) times daily. Notes to patient: 03/30/20   cetirizine 10 MG tablet Commonly known as: ZYRTEC Take 10 mg by mouth daily. Notes to patient: 03/31/20   furosemide 20 MG tablet Commonly known as: LASIX Take 10 mg by mouth daily as needed (fluid retention.).   HYDROcodone-acetaminophen 5-325 MG tablet Commonly known as: Norco Take 1 tablet by mouth every 4 (four) hours as needed for moderate pain.   losartan 25 MG tablet Commonly known as: COZAAR Take 25 mg by mouth at bedtime. Notes to patient: 03/30/20   tamsulosin 0.4 MG Caps capsule Commonly known as: FLOMAX Take 1  capsule (0.4 mg total) by mouth daily. Notes to patient: 03/31/20        Signed: Ray Church, III 03/30/2020, 9:04 PM

## 2020-03-30 NOTE — Progress Notes (Signed)
Patient given discharge, follow up, and medication instructions, verbalized understanding, IV removed, personal belongings with patient, will have friend available to transport him home later this afternoon

## 2020-03-30 NOTE — Plan of Care (Signed)
  Problem: Education: Goal: Required Educational Video(s) Outcome: Adequate for Discharge   Problem: Clinical Measurements: Goal: Ability to maintain clinical measurements within normal limits will improve Outcome: Adequate for Discharge Goal: Postoperative complications will be avoided or minimized Outcome: Adequate for Discharge   Problem: Skin Integrity: Goal: Demonstration of wound healing without infection will improve Outcome: Adequate for Discharge   Problem: Education: Goal: Knowledge of General Education information will improve Description: Including pain rating scale, medication(s)/side effects and non-pharmacologic comfort measures Outcome: Adequate for Discharge   Problem: Health Behavior/Discharge Planning: Goal: Ability to manage health-related needs will improve Outcome: Adequate for Discharge   Problem: Clinical Measurements: Goal: Ability to maintain clinical measurements within normal limits will improve Outcome: Adequate for Discharge Goal: Will remain free from infection Outcome: Adequate for Discharge Goal: Diagnostic test results will improve Outcome: Adequate for Discharge Goal: Respiratory complications will improve Outcome: Adequate for Discharge Goal: Cardiovascular complication will be avoided Outcome: Adequate for Discharge   Problem: Activity: Goal: Risk for activity intolerance will decrease Outcome: Adequate for Discharge   Problem: Nutrition: Goal: Adequate nutrition will be maintained Outcome: Adequate for Discharge   Problem: Coping: Goal: Level of anxiety will decrease Outcome: Adequate for Discharge   Problem: Elimination: Goal: Will not experience complications related to bowel motility Outcome: Adequate for Discharge Goal: Will not experience complications related to urinary retention Outcome: Adequate for Discharge   Problem: Pain Managment: Goal: General experience of comfort will improve Outcome: Adequate for Discharge    Problem: Safety: Goal: Ability to remain free from injury will improve Outcome: Adequate for Discharge   Problem: Skin Integrity: Goal: Risk for impaired skin integrity will decrease Outcome: Adequate for Discharge   

## 2020-08-31 IMAGING — CT CT RENAL STONE PROTOCOL
2 of 4 series · 15 of 46 positions shown, 17 images · non-contrast
Comparison: 12/07/2014

CLINICAL DATA: Right flank pain.  Evaluate for kidney stone.

EXAM:
CT ABDOMEN AND PELVIS WITHOUT CONTRAST
TECHNIQUE: Multidetector CT imaging of the abdomen and pelvis was performed
following the standard protocol without IV contrast.

[Series 2: axial st · axial · 0.78mm/px · z∈[-620,-150]mm · 12 of 108 slices shown, 14 images]
[im 9/108  soft-tissue]
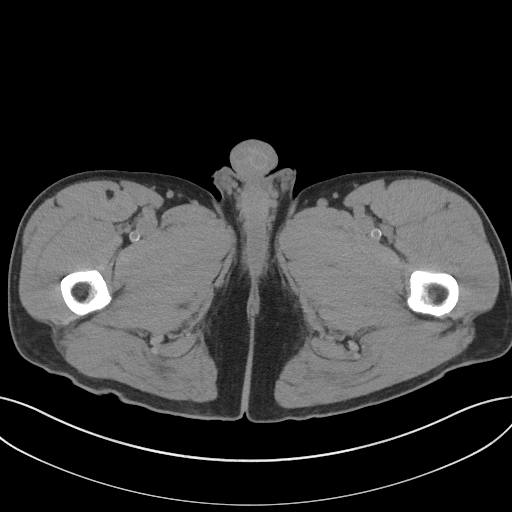
[im 9/108  bone]
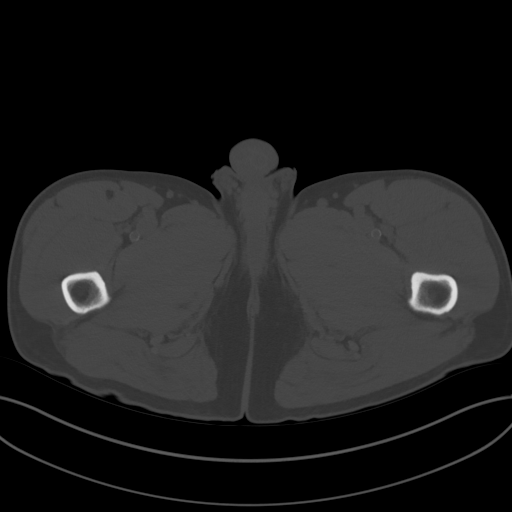
[im 18/108  soft-tissue]
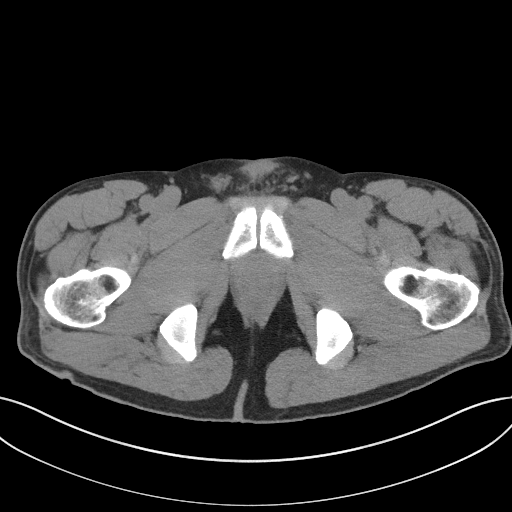
[im 26/108  soft-tissue]
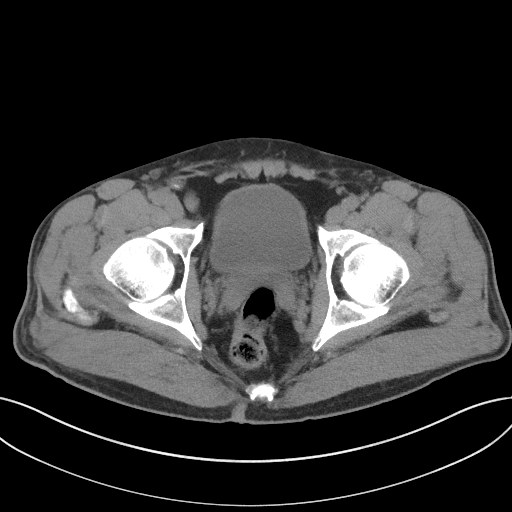
[im 35/108  soft-tissue]
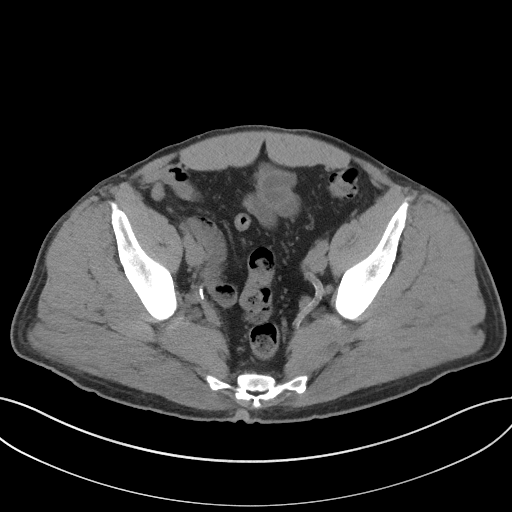
[im 43/108  soft-tissue]
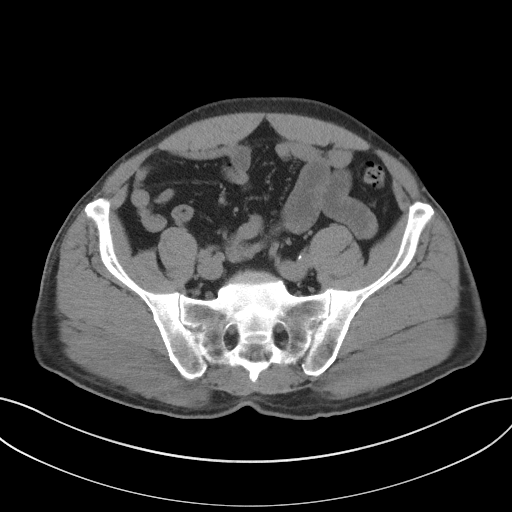
[im 52/108  soft-tissue]
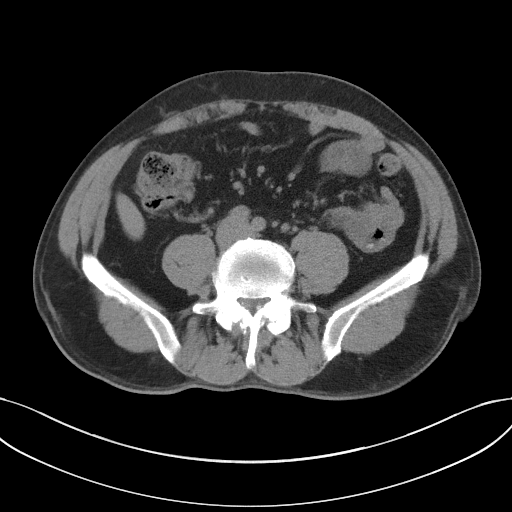
[im 60/108  soft-tissue]
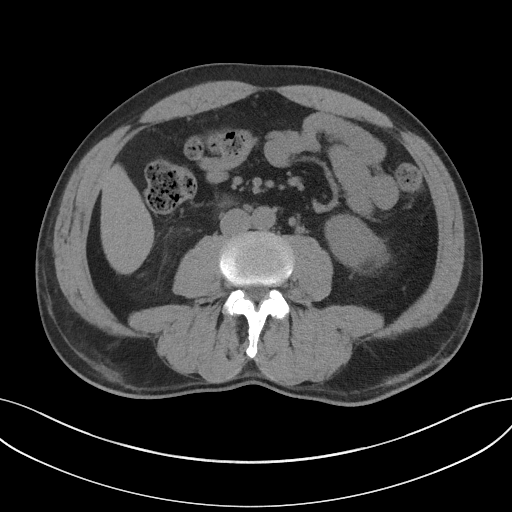
[im 69/108  soft-tissue]
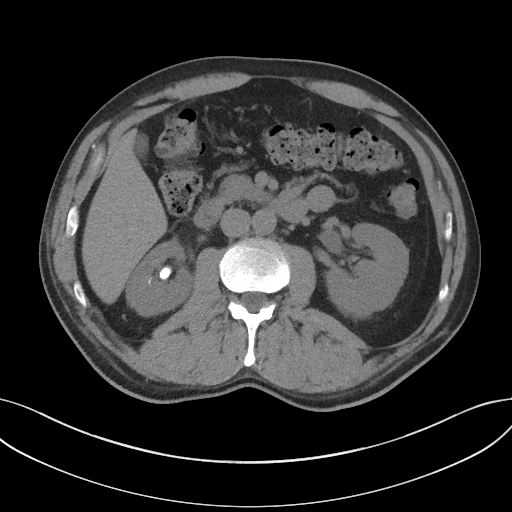
[im 78/108  soft-tissue]
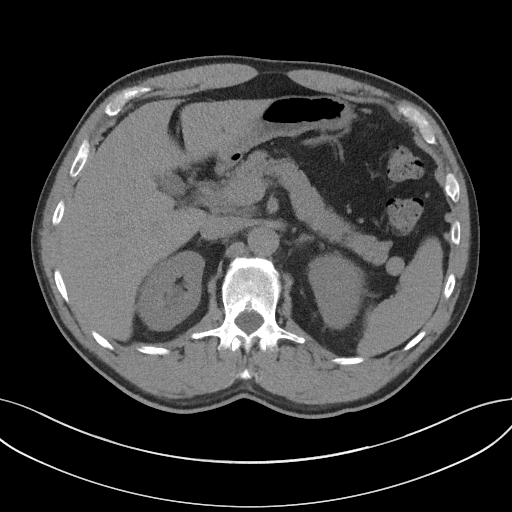
[im 78/108  bone]
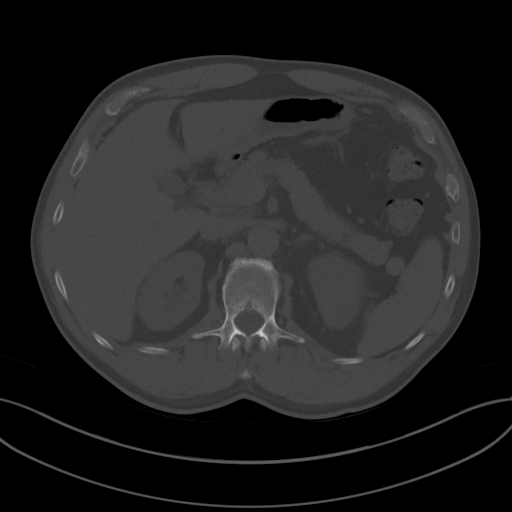
[im 86/108  soft-tissue]
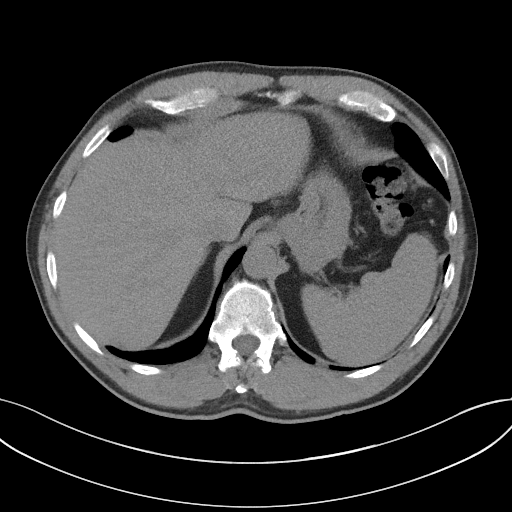
[im 95/108  soft-tissue]
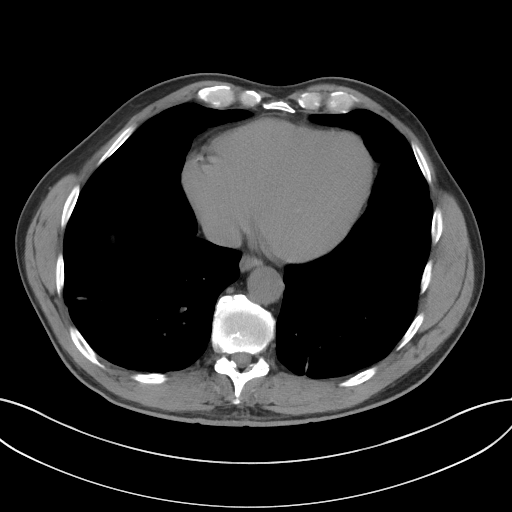
[im 103/108  soft-tissue]
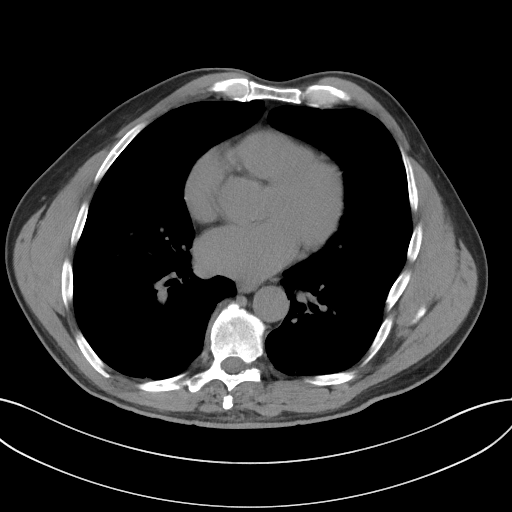

[Series 4: coronal st · coronal · 0.76mm/px · 3 of 96 slices shown]
[im 32/96  soft-tissue]
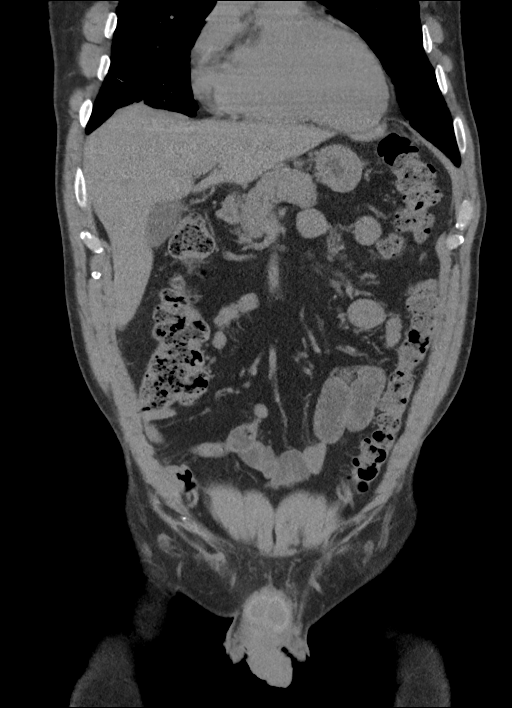
[im 43/96  soft-tissue]
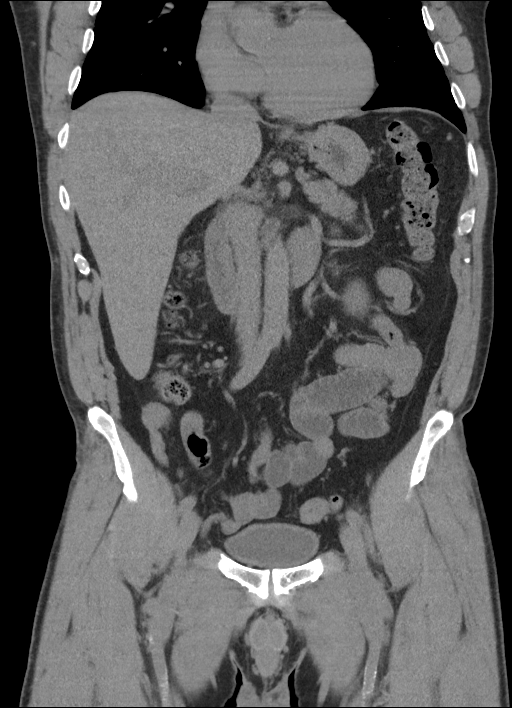
[im 53/96  soft-tissue]
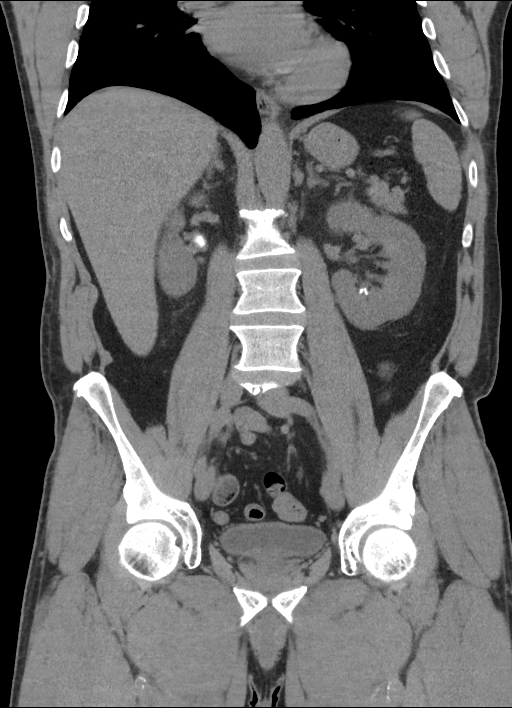

[15 of 46 positions shown; findings below may reference images not displayed]

FINDINGS: Lower chest: Scarring is again noted within the lung bases. No acute
findings.

Hepatobiliary: No suspicious liver abnormality. Small
low-attenuation foci within the lateral segment of left lobe of
liver are too small to characterize measuring up to 6 mm.
Gallbladder unremarkable. No biliary dilatation

Pancreas: Unremarkable. No pancreatic ductal dilatation or
surrounding inflammatory changes.

Spleen: Normal in size without focal abnormality.

Adrenals/Urinary Tract: Normal appearance of the adrenal glands.
Bilateral kidney stones. Multiple large stones within the right
renal pelvis and inferior pole collecting system. The largest stone
is in the right renal pelvis measuring 1.8 by 0.8 cm, image 38/2.
Several small stones are noted within the inferior pole collecting
system of the left kidney. These measure up to 4 mm. No
hydronephrosis identified bilaterally. No ureteral calculi. Cyst
within medial cortex of the right kidney measures 1.6 cm and is
technically incompletely characterized without IV contrast. The
urinary bladder is unremarkable.

Stomach/Bowel: Stomach is within normal limits. Appendix appears
normal. No evidence of bowel wall thickening, distention, or
inflammatory changes.

Vascular/Lymphatic: No significant vascular findings are present. No
enlarged abdominal or pelvic lymph nodes.

Reproductive: Prostate is unremarkable.

Other: No free fluid or fluid collections

Musculoskeletal: No acute or significant osseous findings. Bilateral
L4-5 facet arthropathy. First degree anterolisthesis of L4 on L5
noted with mild degenerative disc disease.
IMPRESSION: 1. Bilateral nephrolithiasis. There is a large stone within the
right renal pelvis measuring 1.8 x 0.8 cm. No significant
hydronephrosis noted.
2. Lumbar degenerative disc disease and facet arthropathy.

## 2020-10-16 IMAGING — XA IR URETURAL STENT RIGHT NEW ACCESS W/O SEP NEPHROSTOMY CATH
2 series · 7 of 7 positions shown · IV contrast (IODINE)
Comparison: CT of the abdomen and pelvis-02/12/2020

INDICATION: Renal stones, access for right percutaneous nephrolithotomy.

EXAM:
1. Percutaneous puncture of the renal collecting system under
fluoroscopic guidance
2. Placement of a percutaneous nephrostomy tube

[Series 2: care body 4 · 2 of 2 frames shown]
[frame 1/2]
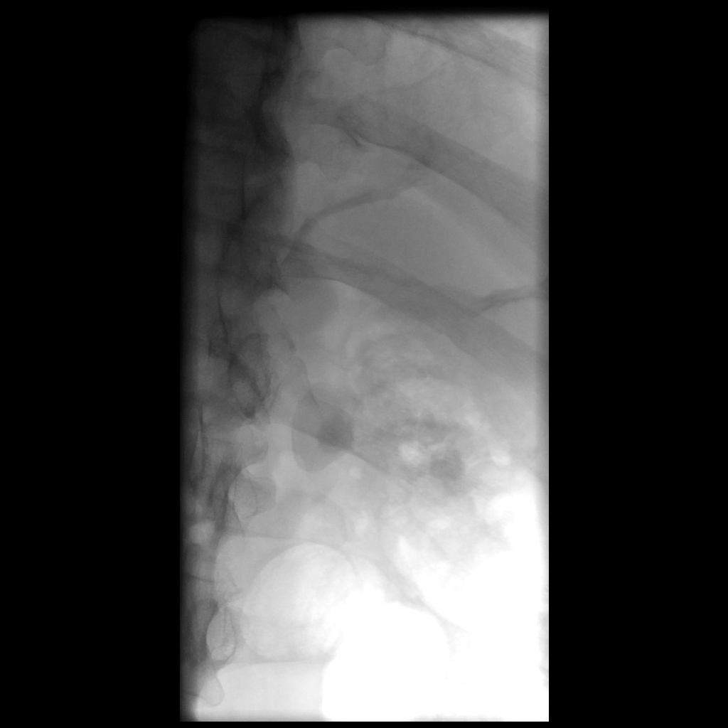
[frame 2/2]
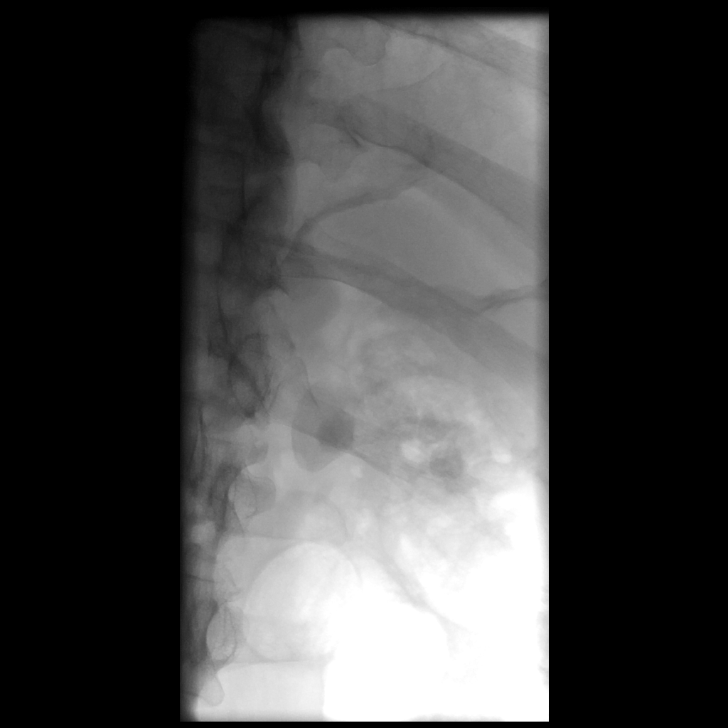

[Series 300: ir ureteral stent right new access w/o s · non-contrast · 5 of 5 slices shown]
[im 1/5]
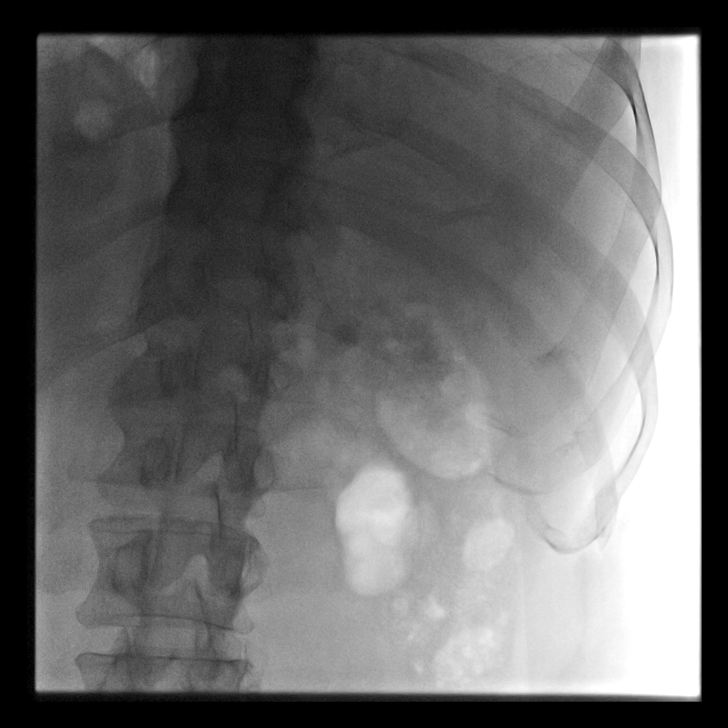
[im 2/5]
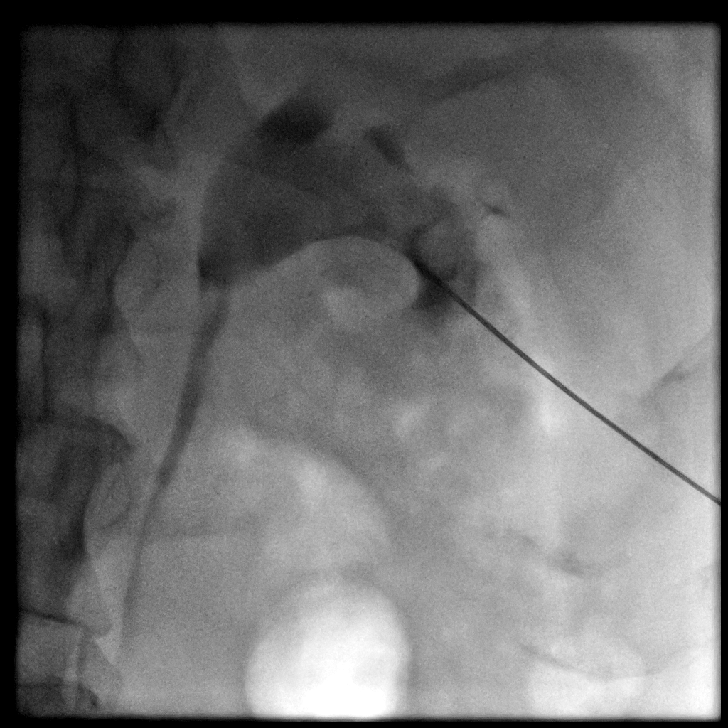
[im 3/5]
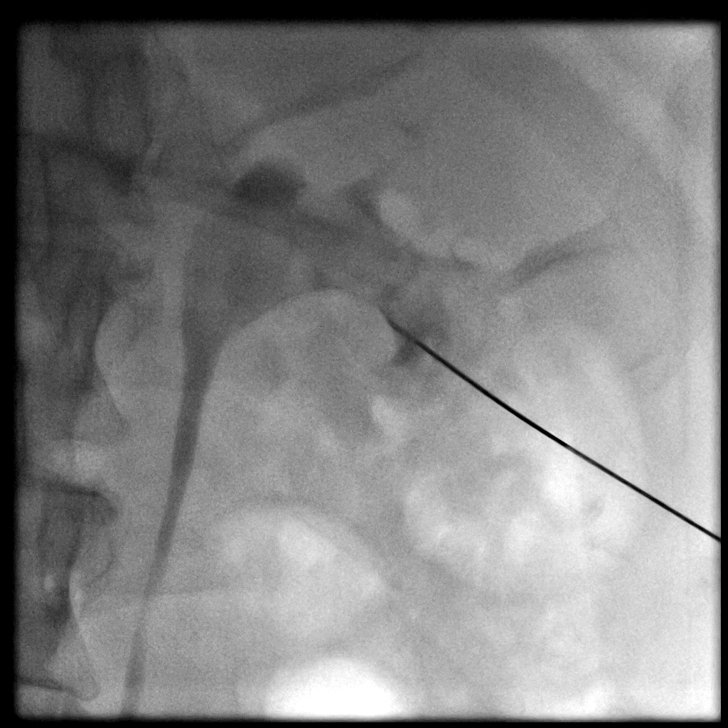
[im 4/5]
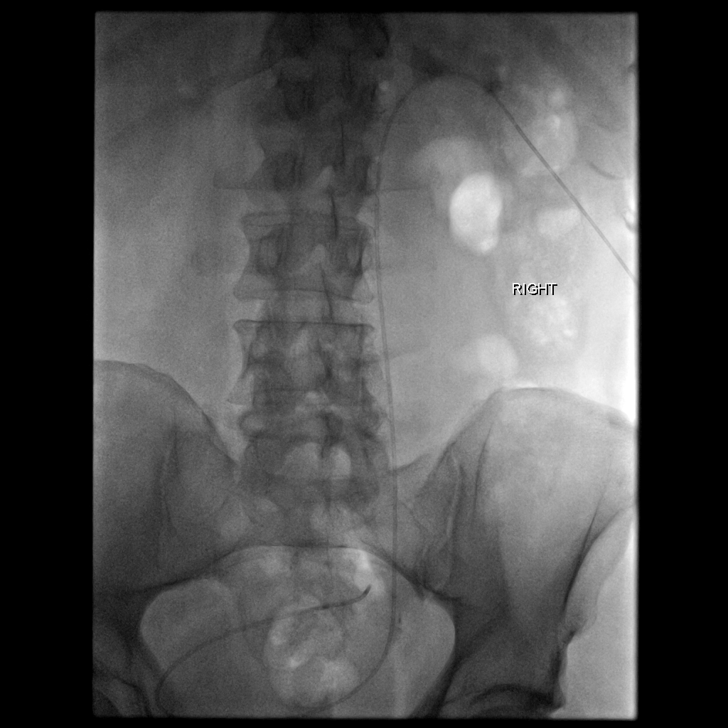
[im 5/5]
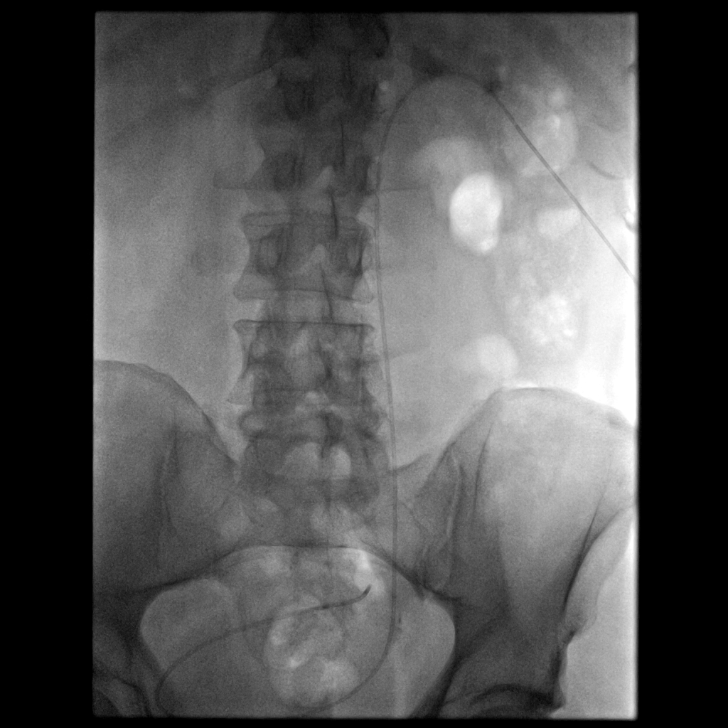

[7 of 7 positions shown; findings below may reference images not displayed]

MEDICATIONS:
2 g Ancef; The antibiotic was administered in an appropriate time
frame prior to skin puncture.

ANESTHESIA/SEDATION:
Fentanyl 100 mcg IV; Versed 4 mg IV

Moderate Sedation Time:  13 minutes

The patient was continuously monitored during the procedure by the
interventional radiology nurse under my direct supervision.

CONTRAST:  10mL OMNIPAQUE IOHEXOL 300 MG/ML SOLN - administered into
the collecting system(s)

FLUOROSCOPY TIME:  Fluoroscopy Time: 2 minutes 36 seconds (88 mGy).

COMPLICATIONS:
None immediate.

PROCEDURE:
Informed written consent was obtained from the patient after a
thorough discussion of the procedural risks, benefits and
alternatives. All questions were addressed. Maximal Sterile Barrier
Technique was utilized including caps, mask, sterile gowns, sterile
gloves, sterile drape, hand hygiene and skin antiseptic. A timeout
was performed prior to the initiation of the procedure.

A pre procedural spot fluoroscopic image was obtained of the upper
abdomen. Ultrasound scanning performed of the kidney was negative
for significant hydronephrosis. As such, the stone within the lower
pole of the right kidney was targeted fluoroscopically with a 22
gauge Chiba needle. Access to the collecting system was confirmed
with advancement of a Nitrex wire into the collecting system. The
needle was exchanged for the inner 3 French catheter from an
Accustick set and contrast injection confirmed access. A small
amount of air was injected into the collecting system to help
delineate a posterior calyx. A posterior inferior calyx was targeted
with a 22 gauge Chiba needle. Access to the calyx was confirmed with
advancement of a Nitrex wire into the collecting system. An
Accustick set was utilized to dilate the tract and was subsequently
exchanged for a Kumpe catheter over a Bentson wire. The Kumpe
catheter was advanced down the ureter and into the urinary bladder.
Postprocedural spot radiographs were obtained in various obliquities
and the catheter was sutured to the skin. The catheter was capped
and a dressing was placed. The patient tolerated the procedure well
without immediate postprocedural complication.
IMPRESSION: Successful fluoroscopic guided right percutaneous nephrostomy with
placement of a 5 French Kumpe catheter to the level of the urinary
bladder to be utilized during impending nephrolithotomy procedure.
